# Patient Record
Sex: Male | Born: 1945 | Race: White | Hispanic: No | Marital: Married | Smoking: Former smoker
Health system: Southern US, Community
[De-identification: ages and names within clinical notes are randomized; demographics above are authoritative.]

## PROBLEM LIST (undated history)

## (undated) DIAGNOSIS — G4733 Obstructive sleep apnea (adult) (pediatric): Secondary | ICD-10-CM

## (undated) DIAGNOSIS — J45909 Unspecified asthma, uncomplicated: Secondary | ICD-10-CM

## (undated) DIAGNOSIS — F419 Anxiety disorder, unspecified: Secondary | ICD-10-CM

## (undated) DIAGNOSIS — K219 Gastro-esophageal reflux disease without esophagitis: Secondary | ICD-10-CM

## (undated) DIAGNOSIS — J449 Chronic obstructive pulmonary disease, unspecified: Secondary | ICD-10-CM

## (undated) DIAGNOSIS — J189 Pneumonia, unspecified organism: Secondary | ICD-10-CM

## (undated) DIAGNOSIS — I509 Heart failure, unspecified: Secondary | ICD-10-CM

## (undated) DIAGNOSIS — C801 Malignant (primary) neoplasm, unspecified: Secondary | ICD-10-CM

## (undated) DIAGNOSIS — G20 Parkinson's disease: Secondary | ICD-10-CM

## (undated) DIAGNOSIS — I10 Essential (primary) hypertension: Secondary | ICD-10-CM

## (undated) DIAGNOSIS — G20A1 Parkinson's disease without dyskinesia, without mention of fluctuations: Secondary | ICD-10-CM

## (undated) DIAGNOSIS — R0600 Dyspnea, unspecified: Secondary | ICD-10-CM

## (undated) HISTORY — PX: VEIN LIGATION AND STRIPPING: SHX2653

## (undated) HISTORY — DX: Heart failure, unspecified: I50.9

## (undated) HISTORY — DX: Anxiety disorder, unspecified: F41.9

## (undated) HISTORY — DX: Essential (primary) hypertension: I10

## (undated) HISTORY — DX: Gastro-esophageal reflux disease without esophagitis: K21.9

## (undated) HISTORY — PX: OTHER SURGICAL HISTORY: SHX169

---

## 2021-08-25 ENCOUNTER — Other Ambulatory Visit: Payer: Self-pay | Admitting: Family Medicine

## 2021-08-25 DIAGNOSIS — R1909 Other intra-abdominal and pelvic swelling, mass and lump: Secondary | ICD-10-CM

## 2021-09-02 ENCOUNTER — Other Ambulatory Visit: Payer: Self-pay

## 2021-09-02 ENCOUNTER — Ambulatory Visit
Admission: RE | Admit: 2021-09-02 | Discharge: 2021-09-02 | Disposition: A | Discharge status: Home / Self Care | Payer: Medicare Other | Source: Ambulatory Visit | Attending: Family Medicine | Admitting: Family Medicine

## 2021-09-02 DIAGNOSIS — R1909 Other intra-abdominal and pelvic swelling, mass and lump: Secondary | ICD-10-CM | POA: Diagnosis not present

## 2021-09-21 ENCOUNTER — Other Ambulatory Visit: Payer: Self-pay | Admitting: Infectious Diseases

## 2021-09-21 DIAGNOSIS — K219 Gastro-esophageal reflux disease without esophagitis: Secondary | ICD-10-CM

## 2021-09-21 DIAGNOSIS — R1031 Right lower quadrant pain: Secondary | ICD-10-CM

## 2021-09-21 DIAGNOSIS — R634 Abnormal weight loss: Secondary | ICD-10-CM

## 2021-09-21 DIAGNOSIS — G20 Parkinson's disease: Secondary | ICD-10-CM

## 2021-10-14 ENCOUNTER — Other Ambulatory Visit: Payer: Self-pay

## 2021-10-14 ENCOUNTER — Ambulatory Visit
Admission: RE | Admit: 2021-10-14 | Discharge: 2021-10-14 | Disposition: A | Discharge status: Home / Self Care | Payer: Medicare Other | Source: Ambulatory Visit | Attending: Infectious Diseases | Admitting: Infectious Diseases

## 2021-10-14 DIAGNOSIS — K219 Gastro-esophageal reflux disease without esophagitis: Secondary | ICD-10-CM

## 2021-10-14 DIAGNOSIS — G20 Parkinson's disease: Secondary | ICD-10-CM

## 2021-10-14 DIAGNOSIS — R634 Abnormal weight loss: Secondary | ICD-10-CM

## 2021-10-14 DIAGNOSIS — R1031 Right lower quadrant pain: Secondary | ICD-10-CM

## 2021-10-15 ENCOUNTER — Ambulatory Visit
Admission: RE | Admit: 2021-10-15 | Discharge: 2021-10-15 | Disposition: A | Discharge status: Home / Self Care | Payer: Medicare Other | Source: Ambulatory Visit | Attending: Infectious Diseases | Admitting: Infectious Diseases

## 2021-10-15 ENCOUNTER — Other Ambulatory Visit: Payer: Self-pay

## 2021-10-15 DIAGNOSIS — R634 Abnormal weight loss: Secondary | ICD-10-CM | POA: Insufficient documentation

## 2021-10-15 DIAGNOSIS — G20 Parkinson's disease: Secondary | ICD-10-CM | POA: Insufficient documentation

## 2021-10-15 DIAGNOSIS — K219 Gastro-esophageal reflux disease without esophagitis: Secondary | ICD-10-CM | POA: Insufficient documentation

## 2021-10-15 DIAGNOSIS — R1031 Right lower quadrant pain: Secondary | ICD-10-CM | POA: Insufficient documentation

## 2021-10-15 IMAGING — CT CT ABD-PELV W/ CM
2 of 5 series · 15 of 46 positions shown, 17 images · IV contrast (omnipaque)
Comparison: None.

CLINICAL DATA: Intermittently palpable right inguinal lumps

EXAM:
CT ABDOMEN AND PELVIS WITH CONTRAST
TECHNIQUE: Multidetector CT imaging of the abdomen and pelvis was performed
using the standard protocol following bolus administration of
intravenous contrast.
CONTRAST:  85mL OMNIPAQUE IOHEXOL 300 MG/ML SOLN, additional oral
enteric contrast

[Series 2: abd pelvis 5.00 · axial · 0.66mm/px · z∈[-1618,-1203]mm · 12 of 95 slices shown, 14 images]
[im 6/95  soft-tissue]
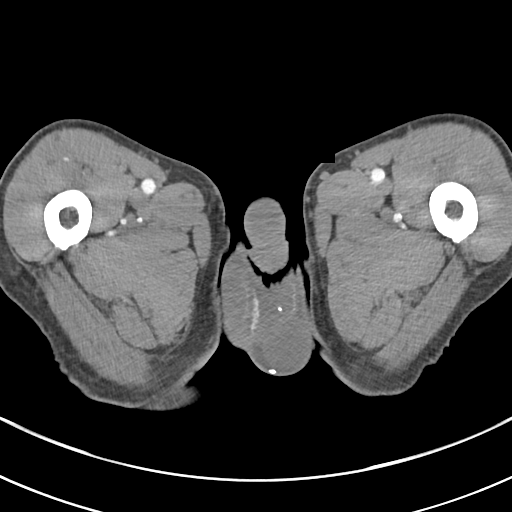
[im 6/95  bone]
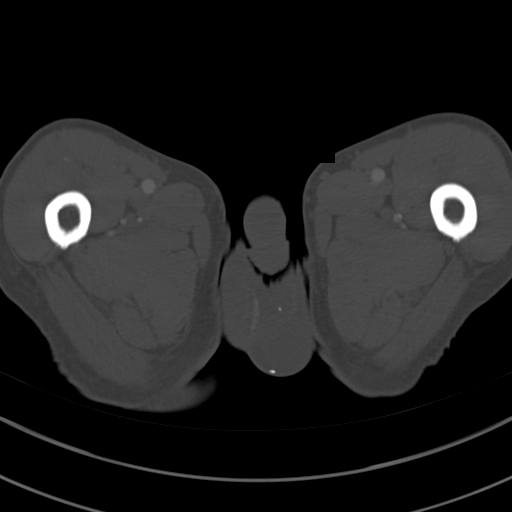
[im 17/95  soft-tissue]
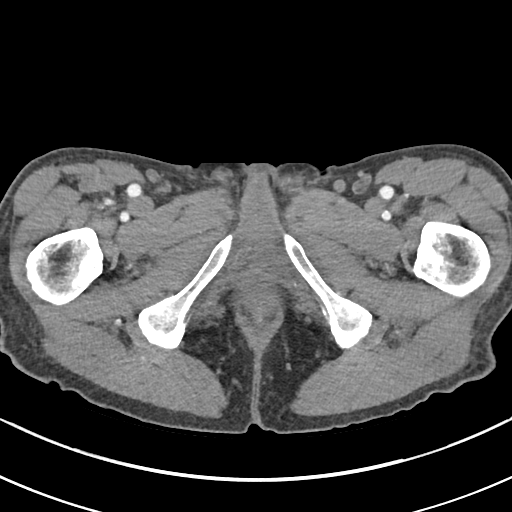
[im 23/95  soft-tissue]
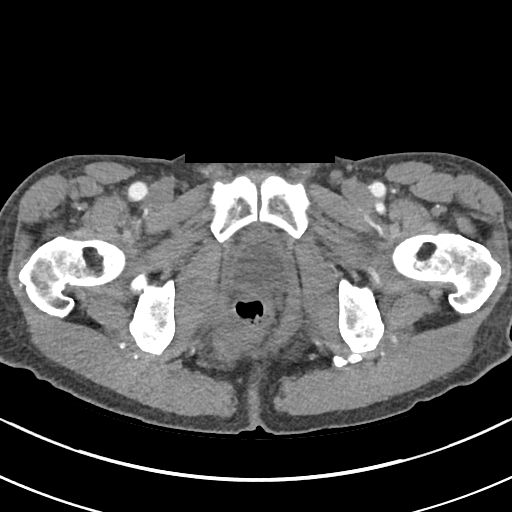
[im 28/95  soft-tissue]
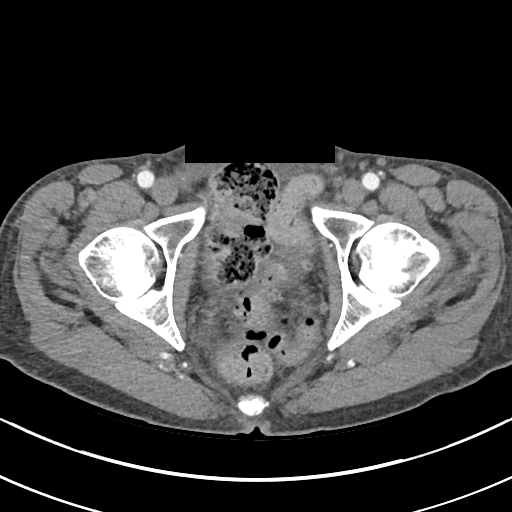
[im 39/95  soft-tissue]
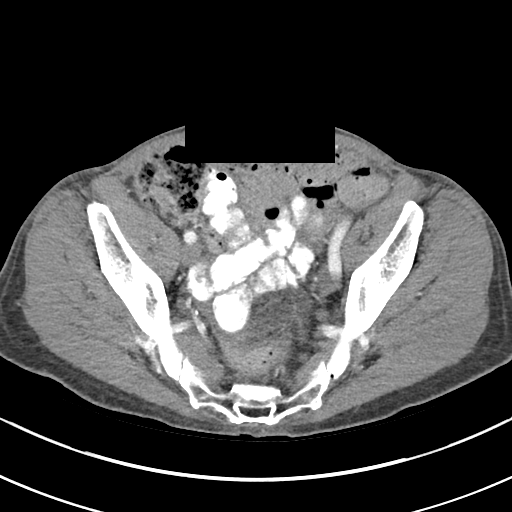
[im 45/95  soft-tissue]
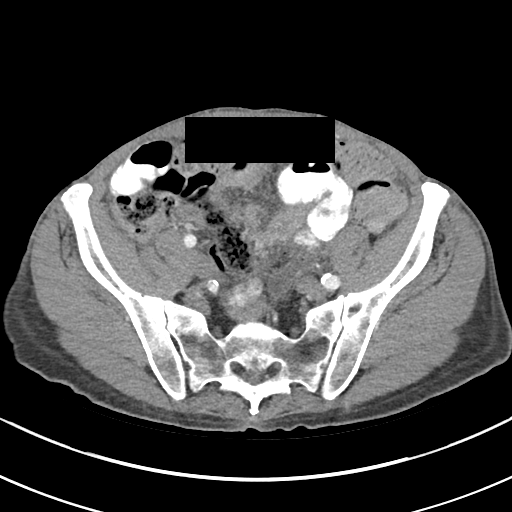
[im 50/95  soft-tissue]
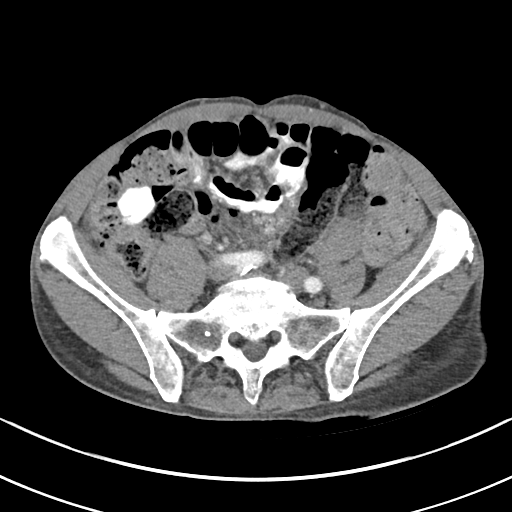
[im 61/95  soft-tissue]
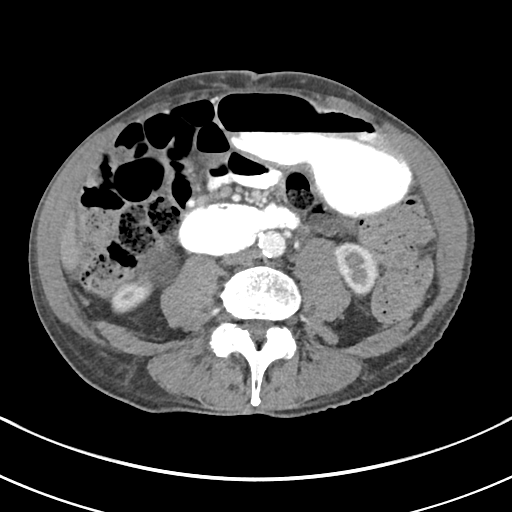
[im 67/95  soft-tissue]
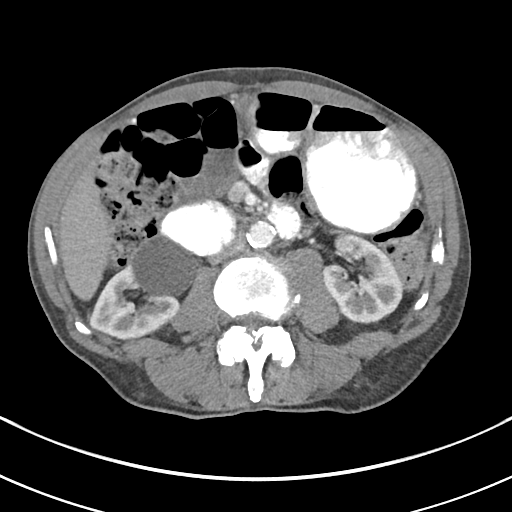
[im 67/95  bone]
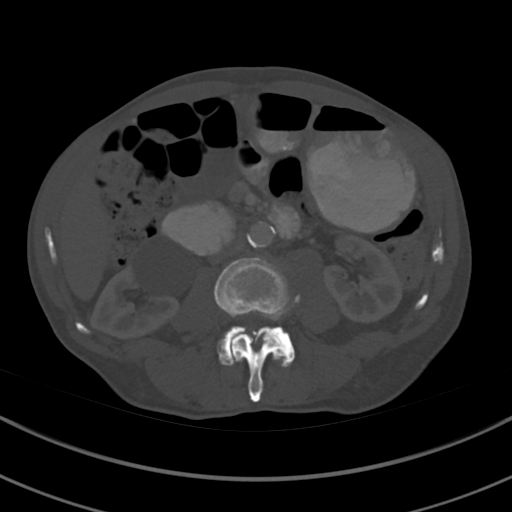
[im 72/95  soft-tissue]
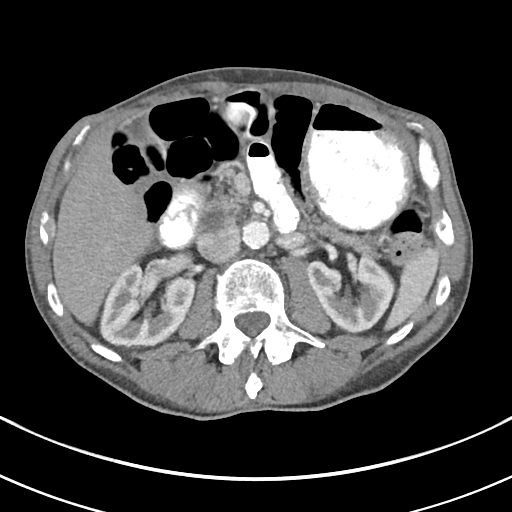
[im 83/95  soft-tissue]
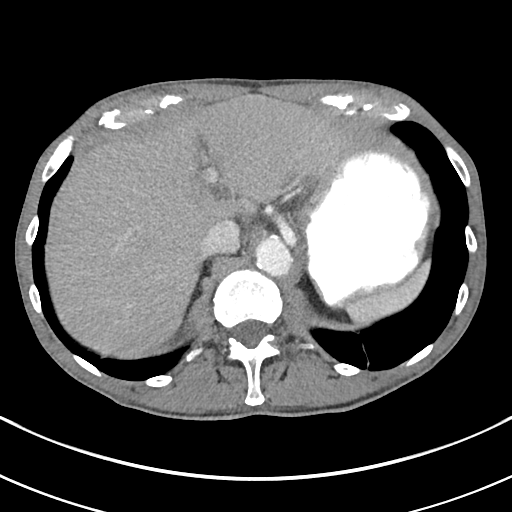
[im 89/95  soft-tissue]
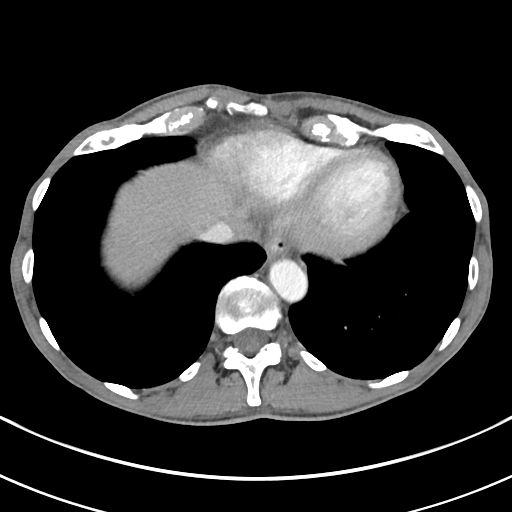

[Series 4: coronals abd pelvis 2.00 cor · coronal · 0.66mm/px · 3 of 125 slices shown]
[im 42/125  soft-tissue]
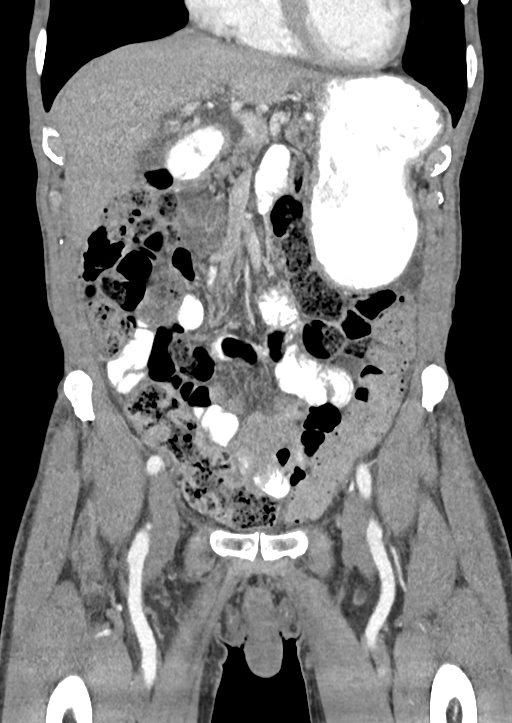
[im 56/125  soft-tissue]
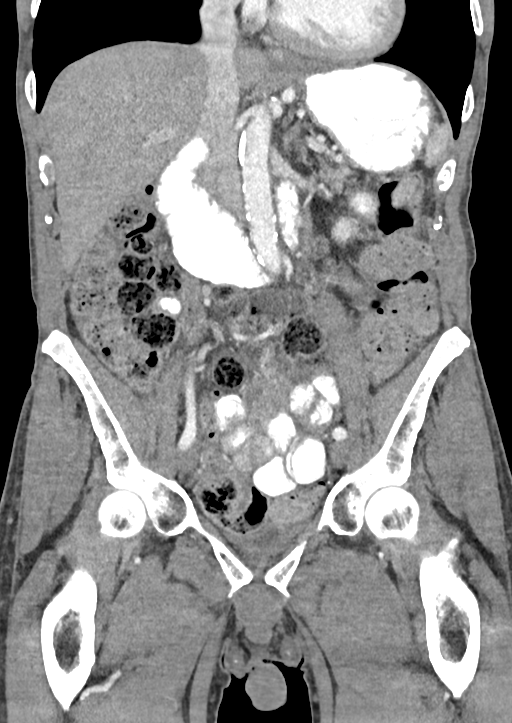
[im 69/125  soft-tissue]
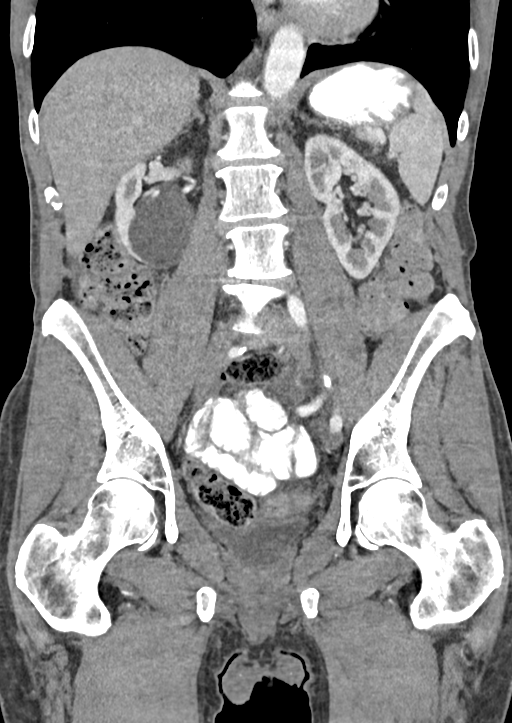

[15 of 46 positions shown; findings below may reference images not displayed]

FINDINGS: Lower chest: No acute abnormality.

Hepatobiliary: No solid liver abnormality is seen. No gallstones or
gallbladder wall thickening. Mild dilatation of the central common
bile duct up to 1.0 cm, without evidence of obstructing calculus or
other abnormality to the ampulla (series 4, image 47).

Pancreas: Unremarkable. No pancreatic ductal dilatation or
surrounding inflammatory changes.

Spleen: Normal in size without significant abnormality.

Adrenals/Urinary Tract: Adrenal glands are unremarkable. Benign
exophytic cyst of the inferior pole the right kidney. Kidneys are
otherwise normal, without renal calculi, solid lesion, or
hydronephrosis. Bladder is unremarkable.

Stomach/Bowel: Stomach is within normal limits. Appendix is not
clearly visualized. No evidence of bowel wall thickening,
distention, or inflammatory changes. Descending and sigmoid
diverticulosis. Large burden of stool throughout the colon and
rectum.

Vascular/Lymphatic: Aortic atherosclerosis. No enlarged abdominal or
pelvic lymph nodes.

Reproductive: Status post prostatectomy.

Other: No abdominal wall hernia or abnormality. No abdominopelvic
ascites.

Musculoskeletal: No acute or significant osseous findings.
IMPRESSION: 1. No mass, hernia, lymphadenopathy, or other abnormality of the
right inguinal region.
2. Status post prostatectomy. No evidence of mass or lymphadenopathy
in the abdomen or pelvis.
3. Mild dilatation of the central common bile duct up to 1.0 cm,
without evidence of obstructing calculus or other abnormality to the
ampulla. Consider further evaluation via MRCP if there is clinical
or biochemical evidence of cholestasis.
4. Descending and sigmoid diverticulosis without evidence of acute
diverticulitis. Large burden of stool throughout the colon and
rectum.

Aortic Atherosclerosis ([3N]-[3N]).

## 2021-10-15 MED ORDER — IOHEXOL 300 MG/ML  SOLN
85.0000 mL | Freq: Once | INTRAMUSCULAR | Status: AC | PRN
  Administered 2021-10-15: 85 mL via INTRAVENOUS

## 2021-10-22 ENCOUNTER — Ambulatory Visit: Admission: RE | Admit: 2021-10-22 | Payer: Medicare Other | Source: Ambulatory Visit

## 2021-11-08 ENCOUNTER — Ambulatory Visit: Payer: Self-pay | Admitting: Surgery

## 2021-11-08 NOTE — H&P (Signed)
Subjective:  CC: Right inguinal hernia [K40.90]  HPI:  Carl Mcclain is a 76 y.o. male who was referred by Renato Shin Fitzgeral* for evaluation of above. Symptoms were first noted 1 year ago. Three areas of enlarging areas. Feels occasional fluid in these "lumps", all localized to right groin, extending into scrotum occasionally. Pain is intermittent and discomfort, confined to the right groin, without radiation.  Associated with lump, exacerbated by exertion.  Lump is reducible.    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  Past Surgical History:  Past Surgical History: Procedure Laterality Date  PROSTATE SURGERY     Family History: family history includes Breast cancer in his mother; Cancer in his maternal grandmother; Heart disease in his father and paternal grandfather; Prostate cancer in his brother.  Social History:  reports that he quit smoking about 43 years ago. His smoking use included cigarettes. He has a 10.00 pack-year smoking history. He has never used smokeless tobacco. He reports that he does not currently use alcohol. He reports that he does not use drugs.  Current Medications: has a current medication list which includes the following prescription(s): budesonide, carbidopa-levodopa, carbidopa-levodopa, mirtazapine, tiotropium bromide, trazodone, ventolin hfa, budesonide-formoterol, trelegy ellipta, and gabapentin.  Allergies:  Allergies as of 11/08/2021  (No Known Allergies)   ROS:  A 15 point review of systems was performed and pertinent positives and negatives noted in HPI   Objective:    BP (!) 143/81    Pulse 95    Ht 182.9 cm (6')    Wt 67.1 kg (148 lb)    BMI 20.07 kg/m   Constitutional :  Alert, cooperative,  no distress Lymphatics/Throat:  Supple, no lymphadenopathy Respiratory:  clear to auscultation bilaterally Cardiovascular:  regular rate and rhythm Gastrointestinal: soft, non-tender; bowel sounds normal; no masses,  no organomegaly. Right groin area with area of weakness, no obvious hernia contents on exam today Musculoskeletal: Steady gait and movement Skin: Cool and moist, abdominal surgical scars  Psychiatric: Normal affect, non-agitated, not confused     LABS:  n/a   RADS: CLINICAL DATA:  Intermittently palpable right inguinal lumps   EXAM:  CT ABDOMEN AND PELVIS WITH CONTRAST   TECHNIQUE:  Multidetector CT imaging of the abdomen and pelvis was performed  using the standard protocol following bolus administration of  intravenous contrast.   CONTRAST:  76mL OMNIPAQUE IOHEXOL 300 MG/ML SOLN, additional oral  enteric contrast   COMPARISON:  None.   FINDINGS:  Lower chest: No acute abnormality.   Hepatobiliary: No solid liver abnormality is seen. No gallstones or  gallbladder wall thickening. Mild dilatation of the central common  bile duct up to 1.0 cm, without evidence of obstructing calculus or  other abnormality to the ampulla (series 4, image 47).   Pancreas: Unremarkable. No pancreatic ductal dilatation or  surrounding inflammatory changes.   Spleen: Normal in size without significant abnormality.   Adrenals/Urinary Tract: Adrenal glands are unremarkable. Benign  exophytic cyst of the inferior pole the right kidney. Kidneys are  otherwise normal, without renal calculi, solid lesion, or  hydronephrosis. Bladder is unremarkable.   Stomach/Bowel: Stomach is within normal limits. Appendix is not  clearly visualized. No evidence of bowel wall thickening,  distention, or inflammatory changes. Descending and sigmoid  diverticulosis. Large burden of stool throughout the colon and  rectum.   Vascular/Lymphatic: Aortic atherosclerosis. No enlarged abdominal or   pelvic lymph nodes.   Reproductive: Status post prostatectomy.   Other: No abdominal wall hernia or abnormality. No abdominopelvic  ascites.   Musculoskeletal: No acute or significant osseous findings.   IMPRESSION:  1. No mass, hernia, lymphadenopathy, or other abnormality of the  right inguinal region.  2. Status post prostatectomy. No evidence of mass or lymphadenopathy  in the abdomen or pelvis.  3. Mild dilatation of the central common bile duct up to 1.0 cm,  without evidence of obstructing calculus or other abnormality to the  ampulla. Consider further evaluation via MRCP if there is clinical  or biochemical evidence of cholestasis.  4. Descending and sigmoid diverticulosis without evidence of acute  diverticulitis. Large burden of stool throughout the colon and  rectum.   Aortic Atherosclerosis (ICD10-I70.0).    Electronically Signed    By: Delanna Ahmadi M.D.    On: 10/18/2021 09:59  Assessment:      Right inguinal hernia [K40.90]  CT not showing anything obvious but history and clinical exam concerning enough to recommend proceeding with repair  Plan:    1. Right inguinal hernia [K40.90]   Discussed the risk of surgery including recurrence, which can be up to 50% in the case of incisional or complex hernias, possible use of prosthetic materials (mesh) and the increased risk of mesh infxn if used, bleeding, chronic pain, post-op infxn, post-op SBO or ileus, and possible re-operation to address said risks. The risks of general anesthetic, if used, includes MI, CVA, sudden death or even reaction to anesthetic medications also discussed. Alternatives include continued observation.  Benefits include possible symptom relief, prevention of incarceration, strangulation, enlargement in size over time, and the risk of emergency surgery in the face of strangulation.   Typical post-op recovery time of 3-5 days with 2 weeks of activity restrictions were also discussed.  ED  return precautions given for sudden increase in pain, size of hernia with accompanying fever, nausea, and/or vomiting.  The patient verbalized understanding and all questions were answered to the patient's satisfaction.   2. Patient has elected to proceed with surgical treatment. Procedure will be scheduled. Open due to hx of prostate surgery.

## 2021-11-08 NOTE — H&P (View-Only) (Signed)
Subjective:  CC: Right inguinal hernia [K40.90]  HPI:  Marciano Mundt is a 76 y.o. male who was referred by Renato Shin Fitzgeral* for evaluation of above. Symptoms were first noted 1 year ago. Three areas of enlarging areas. Feels occasional fluid in these "lumps", all localized to right groin, extending into scrotum occasionally. Pain is intermittent and discomfort, confined to the right groin, without radiation.  Associated with lump, exacerbated by exertion.  Lump is reducible.    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  Past Surgical History:  Past Surgical History: Procedure Laterality Date  PROSTATE SURGERY     Family History: family history includes Breast cancer in his mother; Cancer in his maternal grandmother; Heart disease in his father and paternal grandfather; Prostate cancer in his brother.  Social History:  reports that he quit smoking about 43 years ago. His smoking use included cigarettes. He has a 10.00 pack-year smoking history. He has never used smokeless tobacco. He reports that he does not currently use alcohol. He reports that he does not use drugs.  Current Medications: has a current medication list which includes the following prescription(s): budesonide, carbidopa-levodopa, carbidopa-levodopa, mirtazapine, tiotropium bromide, trazodone, ventolin hfa, budesonide-formoterol, trelegy ellipta, and gabapentin.  Allergies:  Allergies as of 11/08/2021  (No Known Allergies)   ROS:  A 15 point review of systems was performed and pertinent positives and negatives noted in HPI   Objective:    BP (!) 143/81    Pulse 95    Ht 182.9 cm (6')    Wt 67.1 kg (148 lb)    BMI 20.07 kg/m   Constitutional :  Alert, cooperative,  no distress Lymphatics/Throat:  Supple, no lymphadenopathy Respiratory:  clear to auscultation bilaterally Cardiovascular:  regular rate and rhythm Gastrointestinal: soft, non-tender; bowel sounds normal; no masses,  no organomegaly. Right groin area with area of weakness, no obvious hernia contents on exam today Musculoskeletal: Steady gait and movement Skin: Cool and moist, abdominal surgical scars  Psychiatric: Normal affect, non-agitated, not confused     LABS:  n/a   RADS: CLINICAL DATA:  Intermittently palpable right inguinal lumps   EXAM:  CT ABDOMEN AND PELVIS WITH CONTRAST   TECHNIQUE:  Multidetector CT imaging of the abdomen and pelvis was performed  using the standard protocol following bolus administration of  intravenous contrast.   CONTRAST:  12mL OMNIPAQUE IOHEXOL 300 MG/ML SOLN, additional oral  enteric contrast   COMPARISON:  None.   FINDINGS:  Lower chest: No acute abnormality.   Hepatobiliary: No solid liver abnormality is seen. No gallstones or  gallbladder wall thickening. Mild dilatation of the central common  bile duct up to 1.0 cm, without evidence of obstructing calculus or  other abnormality to the ampulla (series 4, image 47).   Pancreas: Unremarkable. No pancreatic ductal dilatation or  surrounding inflammatory changes.   Spleen: Normal in size without significant abnormality.   Adrenals/Urinary Tract: Adrenal glands are unremarkable. Benign  exophytic cyst of the inferior pole the right kidney. Kidneys are  otherwise normal, without renal calculi, solid lesion, or  hydronephrosis. Bladder is unremarkable.   Stomach/Bowel: Stomach is within normal limits. Appendix is not  clearly visualized. No evidence of bowel wall thickening,  distention, or inflammatory changes. Descending and sigmoid  diverticulosis. Large burden of stool throughout the colon and  rectum.   Vascular/Lymphatic: Aortic atherosclerosis. No enlarged abdominal or   pelvic lymph nodes.   Reproductive: Status post prostatectomy.   Other: No abdominal wall hernia or abnormality. No abdominopelvic  ascites.   Musculoskeletal: No acute or significant osseous findings.   IMPRESSION:  1. No mass, hernia, lymphadenopathy, or other abnormality of the  right inguinal region.  2. Status post prostatectomy. No evidence of mass or lymphadenopathy  in the abdomen or pelvis.  3. Mild dilatation of the central common bile duct up to 1.0 cm,  without evidence of obstructing calculus or other abnormality to the  ampulla. Consider further evaluation via MRCP if there is clinical  or biochemical evidence of cholestasis.  4. Descending and sigmoid diverticulosis without evidence of acute  diverticulitis. Large burden of stool throughout the colon and  rectum.   Aortic Atherosclerosis (ICD10-I70.0).    Electronically Signed    By: Delanna Ahmadi M.D.    On: 10/18/2021 09:59  Assessment:      Right inguinal hernia [K40.90]  CT not showing anything obvious but history and clinical exam concerning enough to recommend proceeding with repair  Plan:    1. Right inguinal hernia [K40.90]   Discussed the risk of surgery including recurrence, which can be up to 50% in the case of incisional or complex hernias, possible use of prosthetic materials (mesh) and the increased risk of mesh infxn if used, bleeding, chronic pain, post-op infxn, post-op SBO or ileus, and possible re-operation to address said risks. The risks of general anesthetic, if used, includes MI, CVA, sudden death or even reaction to anesthetic medications also discussed. Alternatives include continued observation.  Benefits include possible symptom relief, prevention of incarceration, strangulation, enlargement in size over time, and the risk of emergency surgery in the face of strangulation.   Typical post-op recovery time of 3-5 days with 2 weeks of activity restrictions were also discussed.  ED  return precautions given for sudden increase in pain, size of hernia with accompanying fever, nausea, and/or vomiting.  The patient verbalized understanding and all questions were answered to the patient's satisfaction.   2. Patient has elected to proceed with surgical treatment. Procedure will be scheduled. Open due to hx of prostate surgery.

## 2021-11-11 ENCOUNTER — Other Ambulatory Visit
Admission: RE | Admit: 2021-11-11 | Discharge: 2021-11-11 | Disposition: A | Discharge status: Home / Self Care | Payer: Medicare Other | Source: Ambulatory Visit | Attending: Surgery | Admitting: Surgery

## 2021-11-11 ENCOUNTER — Other Ambulatory Visit: Payer: Self-pay

## 2021-11-11 DIAGNOSIS — Z01818 Encounter for other preprocedural examination: Secondary | ICD-10-CM

## 2021-11-11 DIAGNOSIS — Z01812 Encounter for preprocedural laboratory examination: Secondary | ICD-10-CM

## 2021-11-11 HISTORY — DX: Chronic obstructive pulmonary disease, unspecified: J44.9

## 2021-11-11 HISTORY — DX: Pneumonia, unspecified organism: J18.9

## 2021-11-11 HISTORY — DX: Unspecified asthma, uncomplicated: J45.909

## 2021-11-11 HISTORY — DX: Dyspnea, unspecified: R06.00

## 2021-11-11 HISTORY — DX: Parkinson's disease without dyskinesia, without mention of fluctuations: G20.A1

## 2021-11-11 HISTORY — DX: Malignant (primary) neoplasm, unspecified: C80.1

## 2021-11-11 HISTORY — DX: Parkinson's disease: G20

## 2021-11-11 NOTE — Patient Instructions (Addendum)
Your procedure is scheduled on: 11/18/21 - Thursday Report to the Registration Desk on the 1st floor of the [facility]. To find out your arrival time, please call [phone] between 1PM - 3PM on: 11/17/21 - Wednesday   REMEMBER: Instructions that are not followed completely may result in serious medical risk, up to and including death; or upon the discretion of your surgeon and anesthesiologist your surgery may need to be rescheduled.  Do not eat food after midnight the night before surgery.  No gum chewing, lozengers or hard candies.  You may however, drink CLEAR liquids up to 2 hours before you are scheduled to arrive for your surgery. Do not drink anything within 2 hours of your scheduled arrival time.  Clear liquids include: - water  - apple juice without pulp - gatorade (not RED, PURPLE, OR BLUE) - black coffee or tea (Do NOT add milk or creamers to the coffee or tea) Do NOT drink anything that is not on this list.   TAKE THESE MEDICATIONS THE MORNING OF SURGERY WITH A SIP OF WATER:  - Tiotropium Bromide Monohydrate (SPIRIVA RESPIMAT) 2.5 MCG/ACT AERS - Carbidopa-Levodopa ER (SINEMET CR) 25-100 MG tablet controlled release  Use  albuterol (VENTOLIN HFA) 108 (90 Base) MCG/ACT inhaler on the day of surgery and bring to the hospital.  One week prior to surgery: Stop Anti-inflammatories (NSAIDS) such as Advil, Aleve, Ibuprofen, Motrin, Naproxen, Naprosyn and Aspirin based products such as Excedrin, Goodys Powder, BC Powder.  Stop ANY OVER THE COUNTER supplements until after surgery.  You may however, continue to take Tylenol if needed for pain up until the day of surgery.  No Alcohol for 24 hours before or after surgery.  No Smoking including e-cigarettes for 24 hours prior to surgery.  No chewable tobacco products for at least 6 hours prior to surgery.  No nicotine patches on the day of surgery.  Do not use any "recreational" drugs for at least a week prior to your  surgery.  Please be advised that the combination of cocaine and anesthesia may have negative outcomes, up to and including death. If you test positive for cocaine, your surgery will be cancelled.  On the morning of surgery brush your teeth with toothpaste and water, you may rinse your mouth with mouthwash if you wish. Do not swallow any toothpaste or mouthwash.  Use CHG Soap or wipes as directed on instruction sheet.  Do not wear jewelry, make-up, hairpins, clips or nail polish.  Do not wear lotions, powders, or perfumes.   Do not shave body from the neck down 48 hours prior to surgery just in case you cut yourself which could leave a site for infection.  Also, freshly shaved skin may become irritated if using the CHG soap.  Contact lenses, hearing aids and dentures may not be worn into surgery.  Do not bring valuables to the hospital. [facility] is not responsible for any missing/lost belongings or valuables.   Notify your doctor if there is any change in your medical condition (cold, fever, infection).  Wear comfortable clothing (specific to your surgery type) to the hospital.  After surgery, you can help prevent lung complications by doing breathing exercises.  Take deep breaths and cough every 1-2 hours. Your doctor may order a device called an Incentive Spirometer to help you take deep breaths. When coughing or sneezing, hold a pillow firmly against your incision with both hands. This is called splinting. Doing this helps protect your incision. It also decreases  belly discomfort.  If you are being admitted to the hospital overnight, leave your suitcase in the car. After surgery it may be brought to your room.  If you are being discharged the day of surgery, you will not be allowed to drive home. You will need a responsible adult (18 years or older) to drive you home and stay with you that night.   If you are taking public transportation, you will need to have a responsible  adult (18 years or older) with you. Please confirm with your physician that it is acceptable to use public transportation.   Please call the [facility] at [phone] if you have any questions about these instructions.  Surgery Visitation Policy:  Patients undergoing a surgery or procedure may have one family member or support person with them as long as that person is not COVID-19 positive or experiencing its symptoms.  That person may remain in the waiting area during the procedure and may rotate out with other people.  Inpatient Visitation:    Visiting hours are 7 a.m. to 8 p.m. Up to two visitors ages 16+ are allowed at one time in a patient room. The visitors may rotate out with other people during the day. Visitors must check out when they leave, or other visitors will not be allowed. One designated support person may remain overnight. The visitor must pass COVID-19 screenings, use hand sanitizer when entering and exiting the patients room and wear a mask at all times, including in the patients room. Patients must also wear a mask when staff or their visitor are in the room. Masking is required regardless of vaccination status.

## 2021-11-12 ENCOUNTER — Other Ambulatory Visit
Admission: RE | Admit: 2021-11-12 | Discharge: 2021-11-12 | Disposition: A | Discharge status: Home / Self Care | Payer: Medicare Other | Source: Ambulatory Visit | Attending: Surgery | Admitting: Surgery

## 2021-11-12 DIAGNOSIS — Z01818 Encounter for other preprocedural examination: Secondary | ICD-10-CM | POA: Insufficient documentation

## 2021-11-12 DIAGNOSIS — Z01810 Encounter for preprocedural cardiovascular examination: Secondary | ICD-10-CM

## 2021-11-12 LAB — CBC
HCT: 39.9 % (ref 39.0–52.0)
Hemoglobin: 13.5 g/dL (ref 13.0–17.0)
MCH: 31.8 pg (ref 26.0–34.0)
MCHC: 33.8 g/dL (ref 30.0–36.0)
MCV: 93.9 fL (ref 80.0–100.0)
Platelets: 185 10*3/uL (ref 150–400)
RBC: 4.25 MIL/uL (ref 4.22–5.81)
RDW: 13.2 % (ref 11.5–15.5)
WBC: 4.7 10*3/uL (ref 4.0–10.5)
nRBC: 0 % (ref 0.0–0.2)

## 2021-11-12 LAB — BASIC METABOLIC PANEL
Anion gap: 8 (ref 5–15)
BUN: 19 mg/dL (ref 8–23)
CO2: 28 mmol/L (ref 22–32)
Calcium: 8.9 mg/dL (ref 8.9–10.3)
Chloride: 97 mmol/L — ABNORMAL LOW (ref 98–111)
Creatinine, Ser: 1.05 mg/dL (ref 0.61–1.24)
GFR, Estimated: >60 mL/min (ref >60)
Glucose, Bld: 104 mg/dL — ABNORMAL HIGH (ref 70–99)
Potassium: 3.9 mmol/L (ref 3.5–5.1)
Sodium: 133 mmol/L — ABNORMAL LOW (ref 135–145)

## 2021-11-12 NOTE — Pre-Procedure Instructions (Signed)
Faxed request for cardiac clearance per anesthesia to Dr Brandy Hale office. Their office will have to set up clearance due to pt not having cardiologist. Faxed this over with ekg with fax confirmation confirmed

## 2021-11-15 NOTE — Pre-Procedure Instructions (Signed)
Called over to Dr Ines Bloomer office to make sure they received the fax for cardiac clearance that I faxed over late Friday evening. Spoke with Charlena Cross and she confirmed with nurse that they did receive cardiac clearance request

## 2021-11-17 MED ORDER — FAMOTIDINE 20 MG PO TABS: 20.0000 mg | ORAL_TABLET | Freq: Once | ORAL | Status: AC

## 2021-11-17 MED ORDER — CHLORHEXIDINE GLUCONATE 0.12 % MT SOLN: 15.0000 mL | Freq: Once | OROMUCOSAL | Status: AC

## 2021-11-17 MED ORDER — CELECOXIB 200 MG PO CAPS: 200.0000 mg | ORAL_CAPSULE | ORAL | Status: AC

## 2021-11-17 MED ORDER — CEFAZOLIN SODIUM-DEXTROSE 2-4 GM/100ML-% IV SOLN
2.0000 g | INTRAVENOUS | Status: AC
  Administered 2021-11-18: 2 g via INTRAVENOUS

## 2021-11-17 MED ORDER — GABAPENTIN 300 MG PO CAPS: 300.0000 mg | ORAL_CAPSULE | ORAL | Status: AC

## 2021-11-17 MED ORDER — ACETAMINOPHEN 500 MG PO TABS: 1000.0000 mg | ORAL_TABLET | ORAL | Status: AC

## 2021-11-17 MED ORDER — LACTATED RINGERS IV SOLN: INTRAVENOUS | Status: DC

## 2021-11-17 MED ORDER — CHLORHEXIDINE GLUCONATE CLOTH 2 % EX PADS: 6.0000 | MEDICATED_PAD | Freq: Once | CUTANEOUS | Status: DC

## 2021-11-17 MED ORDER — ORAL CARE MOUTH RINSE: 15.0000 mL | Freq: Once | OROMUCOSAL | Status: AC

## 2021-11-18 ENCOUNTER — Other Ambulatory Visit: Payer: Self-pay

## 2021-11-18 ENCOUNTER — Encounter: Payer: Self-pay | Admitting: Surgery

## 2021-11-18 ENCOUNTER — Encounter
Admission: RE | Disposition: A | Discharge status: Home / Self Care | Payer: Self-pay | Source: Home / Self Care | Attending: Surgery

## 2021-11-18 ENCOUNTER — Ambulatory Visit
Admission: RE | Admit: 2021-11-18 | Discharge: 2021-11-18 | Disposition: A | Discharge status: Home / Self Care | Payer: Medicare Other | Attending: Surgery | Admitting: Surgery

## 2021-11-18 ENCOUNTER — Ambulatory Visit: Payer: Medicare Other | Admitting: Anesthesiology

## 2021-11-18 DIAGNOSIS — Z79899 Other long term (current) drug therapy: Secondary | ICD-10-CM | POA: Insufficient documentation

## 2021-11-18 DIAGNOSIS — Z87891 Personal history of nicotine dependence: Secondary | ICD-10-CM | POA: Insufficient documentation

## 2021-11-18 DIAGNOSIS — K4090 Unilateral inguinal hernia, without obstruction or gangrene, not specified as recurrent: Secondary | ICD-10-CM | POA: Insufficient documentation

## 2021-11-18 DIAGNOSIS — J449 Chronic obstructive pulmonary disease, unspecified: Secondary | ICD-10-CM | POA: Insufficient documentation

## 2021-11-18 HISTORY — PX: INSERTION OF MESH: SHX5868

## 2021-11-18 HISTORY — PX: INGUINAL HERNIA REPAIR: SHX194

## 2021-11-18 SURGERY — REPAIR, HERNIA, INGUINAL, ADULT
Anesthesia: General | Site: Groin | Laterality: Right

## 2021-11-18 MED ORDER — CEFAZOLIN SODIUM-DEXTROSE 2-4 GM/100ML-% IV SOLN
INTRAVENOUS | Status: AC
  Filled 2021-11-18: qty 100

## 2021-11-18 MED ORDER — BUPIVACAINE-EPINEPHRINE (PF) 0.5% -1:200000 IJ SOLN
INTRAMUSCULAR | Status: AC
  Filled 2021-11-18: qty 30

## 2021-11-18 MED ORDER — FENTANYL CITRATE (PF) 100 MCG/2ML IJ SOLN
INTRAMUSCULAR | Status: AC
  Administered 2021-11-18: 25 ug via INTRAVENOUS
  Filled 2021-11-18: qty 2

## 2021-11-18 MED ORDER — 0.9 % SODIUM CHLORIDE (POUR BTL) OPTIME
TOPICAL | Status: DC | PRN
  Administered 2021-11-18: 100 mL

## 2021-11-18 MED ORDER — ONDANSETRON HCL 4 MG/2ML IJ SOLN
INTRAMUSCULAR | Status: AC
  Filled 2021-11-18: qty 2

## 2021-11-18 MED ORDER — LIDOCAINE HCL (PF) 2 % IJ SOLN
INTRAMUSCULAR | Status: AC
  Filled 2021-11-18: qty 5

## 2021-11-18 MED ORDER — DEXAMETHASONE SODIUM PHOSPHATE 10 MG/ML IJ SOLN
INTRAMUSCULAR | Status: AC
  Filled 2021-11-18: qty 1

## 2021-11-18 MED ORDER — DEXAMETHASONE SODIUM PHOSPHATE 10 MG/ML IJ SOLN
INTRAMUSCULAR | Status: DC | PRN
  Administered 2021-11-18: 10 mg via INTRAVENOUS

## 2021-11-18 MED ORDER — ALBUTEROL SULFATE HFA 108 (90 BASE) MCG/ACT IN AERS
INHALATION_SPRAY | RESPIRATORY_TRACT | Status: AC
  Filled 2021-11-18: qty 6.7

## 2021-11-18 MED ORDER — GABAPENTIN 300 MG PO CAPS
ORAL_CAPSULE | ORAL | Status: AC
  Administered 2021-11-18: 300 mg via ORAL
  Filled 2021-11-18: qty 1

## 2021-11-18 MED ORDER — BUPIVACAINE-EPINEPHRINE (PF) 0.5% -1:200000 IJ SOLN
INTRAMUSCULAR | Status: DC | PRN
  Administered 2021-11-18: 10 mL

## 2021-11-18 MED ORDER — FENTANYL CITRATE (PF) 100 MCG/2ML IJ SOLN
INTRAMUSCULAR | Status: DC | PRN
  Administered 2021-11-18: 50 ug via INTRAVENOUS
  Administered 2021-11-18: 25 ug via INTRAVENOUS

## 2021-11-18 MED ORDER — CELECOXIB 200 MG PO CAPS
ORAL_CAPSULE | ORAL | Status: AC
  Administered 2021-11-18: 200 mg via ORAL
  Filled 2021-11-18: qty 1

## 2021-11-18 MED ORDER — BUPIVACAINE-MELOXICAM ER 200-6 MG/7ML IJ SOLN
INTRAMUSCULAR | Status: DC | PRN
  Administered 2021-11-18: 200 mg

## 2021-11-18 MED ORDER — PROPOFOL 10 MG/ML IV BOLUS
INTRAVENOUS | Status: AC
  Filled 2021-11-18: qty 20

## 2021-11-18 MED ORDER — ONDANSETRON HCL 4 MG/2ML IJ SOLN
INTRAMUSCULAR | Status: DC | PRN
  Administered 2021-11-18: 4 mg via INTRAVENOUS

## 2021-11-18 MED ORDER — FENTANYL CITRATE (PF) 100 MCG/2ML IJ SOLN
25.0000 ug | INTRAMUSCULAR | Status: DC | PRN
  Administered 2021-11-18: 25 ug via INTRAVENOUS

## 2021-11-18 MED ORDER — ALBUTEROL SULFATE HFA 108 (90 BASE) MCG/ACT IN AERS
INHALATION_SPRAY | RESPIRATORY_TRACT | Status: DC | PRN
Start: 2021-11-18 — End: 2021-11-18
  Administered 2021-11-18: 4 via RESPIRATORY_TRACT

## 2021-11-18 MED ORDER — LIDOCAINE HCL (CARDIAC) PF 100 MG/5ML IV SOSY
PREFILLED_SYRINGE | INTRAVENOUS | Status: DC | PRN
  Administered 2021-11-18: 40 mg via INTRAVENOUS
  Administered 2021-11-18: 60 mg via INTRAVENOUS

## 2021-11-18 MED ORDER — ACETAMINOPHEN 500 MG PO TABS
ORAL_TABLET | ORAL | Status: AC
  Administered 2021-11-18: 1000 mg via ORAL
  Filled 2021-11-18: qty 2

## 2021-11-18 MED ORDER — BUPIVACAINE-MELOXICAM ER 200-6 MG/7ML IJ SOLN
INTRAMUSCULAR | Status: AC
  Filled 2021-11-18: qty 1

## 2021-11-18 MED ORDER — OXYCODONE HCL 5 MG/5ML PO SOLN: 5.0000 mg | Freq: Once | ORAL | Status: DC | PRN

## 2021-11-18 MED ORDER — ROCURONIUM BROMIDE 100 MG/10ML IV SOLN
INTRAVENOUS | Status: DC | PRN
  Administered 2021-11-18: 50 mg via INTRAVENOUS

## 2021-11-18 MED ORDER — CHLORHEXIDINE GLUCONATE 0.12 % MT SOLN
OROMUCOSAL | Status: AC
  Administered 2021-11-18: 15 mL via OROMUCOSAL
  Filled 2021-11-18: qty 15

## 2021-11-18 MED ORDER — HYDROCODONE-ACETAMINOPHEN 5-325 MG PO TABS: 1.0000 | ORAL_TABLET | Freq: Four times a day (QID) | ORAL | 0 refills | Status: DC | PRN

## 2021-11-18 MED ORDER — ACETAMINOPHEN 325 MG PO TABS: 650.0000 mg | ORAL_TABLET | Freq: Three times a day (TID) | ORAL | 0 refills | Status: AC | PRN

## 2021-11-18 MED ORDER — DOCUSATE SODIUM 100 MG PO CAPS: 100.0000 mg | ORAL_CAPSULE | Freq: Two times a day (BID) | ORAL | 0 refills | Status: AC | PRN

## 2021-11-18 MED ORDER — PHENYLEPHRINE 40 MCG/ML (10ML) SYRINGE FOR IV PUSH (FOR BLOOD PRESSURE SUPPORT)
PREFILLED_SYRINGE | INTRAVENOUS | Status: DC | PRN
  Administered 2021-11-18 (×2): 160 ug via INTRAVENOUS

## 2021-11-18 MED ORDER — SUGAMMADEX SODIUM 200 MG/2ML IV SOLN
INTRAVENOUS | Status: DC | PRN
  Administered 2021-11-18 (×2): 100 mg via INTRAVENOUS

## 2021-11-18 MED ORDER — GLYCOPYRROLATE 0.2 MG/ML IJ SOLN
INTRAMUSCULAR | Status: AC
  Filled 2021-11-18: qty 1

## 2021-11-18 MED ORDER — FAMOTIDINE 20 MG PO TABS
ORAL_TABLET | ORAL | Status: AC
  Administered 2021-11-18: 20 mg via ORAL
  Filled 2021-11-18: qty 1

## 2021-11-18 MED ORDER — EPHEDRINE SULFATE (PRESSORS) 50 MG/ML IJ SOLN
INTRAMUSCULAR | Status: DC | PRN
  Administered 2021-11-18: 10 mg via INTRAVENOUS

## 2021-11-18 MED ORDER — OXYCODONE HCL 5 MG PO TABS: 5.0000 mg | ORAL_TABLET | Freq: Once | ORAL | Status: DC | PRN

## 2021-11-18 MED ORDER — PROPOFOL 10 MG/ML IV BOLUS
INTRAVENOUS | Status: DC | PRN
  Administered 2021-11-18: 130 mg via INTRAVENOUS

## 2021-11-18 MED ORDER — PHENYLEPHRINE HCL (PRESSORS) 10 MG/ML IV SOLN: INTRAVENOUS | Status: DC | PRN

## 2021-11-18 MED ORDER — FENTANYL CITRATE (PF) 100 MCG/2ML IJ SOLN
INTRAMUSCULAR | Status: AC
  Filled 2021-11-18: qty 2

## 2021-11-18 MED ORDER — PHENYLEPHRINE HCL-NACL 20-0.9 MG/250ML-% IV SOLN
INTRAVENOUS | Status: DC | PRN
Start: 2021-11-18 — End: 2021-11-18
  Administered 2021-11-18: 35 ug/min via INTRAVENOUS

## 2021-11-18 MED ORDER — ROCURONIUM BROMIDE 10 MG/ML (PF) SYRINGE
PREFILLED_SYRINGE | INTRAVENOUS | Status: AC
  Filled 2021-11-18: qty 10

## 2021-11-18 MED ORDER — GLYCOPYRROLATE 0.2 MG/ML IJ SOLN
INTRAMUSCULAR | Status: DC | PRN
  Administered 2021-11-18: .2 mg via INTRAVENOUS

## 2021-11-18 SURGICAL SUPPLY — 44 items
ADH SKN CLS APL DERMABOND .7 (GAUZE/BANDAGES/DRESSINGS) ×2
APL PRP STRL LF DISP 70% ISPRP (MISCELLANEOUS) ×2
BLADE CLIPPER SURG (BLADE) ×3 IMPLANT
BLADE SURG 15 STRL LF DISP TIS (BLADE) ×2 IMPLANT
BLADE SURG 15 STRL SS (BLADE) ×3
CHLORAPREP W/TINT 26 (MISCELLANEOUS) ×3 IMPLANT
DERMABOND ADVANCED (GAUZE/BANDAGES/DRESSINGS) ×1
DERMABOND ADVANCED .7 DNX12 (GAUZE/BANDAGES/DRESSINGS) ×2 IMPLANT
DRAIN PENROSE 12X.25 LTX STRL (MISCELLANEOUS) ×3 IMPLANT
DRAPE LAPAROTOMY 100X77 ABD (DRAPES) ×3 IMPLANT
ELECT REM PT RETURN 9FT ADLT (ELECTROSURGICAL) ×3
ELECTRODE REM PT RTRN 9FT ADLT (ELECTROSURGICAL) ×2 IMPLANT
GAUZE 4X4 16PLY ~~LOC~~+RFID DBL (SPONGE) ×3 IMPLANT
GLOVE SURG SYN 6.5 ES PF (GLOVE) ×12 IMPLANT
GLOVE SURG SYN 6.5 PF PI (GLOVE) ×4 IMPLANT
GLOVE SURG UNDER POLY LF SZ7 (GLOVE) ×6 IMPLANT
GOWN STRL REUS W/ TWL LRG LVL3 (GOWN DISPOSABLE) ×4 IMPLANT
GOWN STRL REUS W/TWL LRG LVL3 (GOWN DISPOSABLE) ×12
LABEL OR SOLS (LABEL) ×3 IMPLANT
MANIFOLD NEPTUNE II (INSTRUMENTS) ×3 IMPLANT
MESH HERNIA 6X13 (Mesh General) IMPLANT
MESH HERNIA ULTRAPRO MED (Mesh General) IMPLANT
MESH MARLEX PLUG MEDIUM (Mesh General) IMPLANT
MESH PARIETEX PROGRIP LEFT (Mesh General) IMPLANT
MESH PARIETEX PROGRIP RIGHT (Mesh General) ×1 IMPLANT
MESH SYNTHETIC KEYHOLE S (Mesh General) IMPLANT
NEEDLE HYPO 22GX1.5 SAFETY (NEEDLE) ×6 IMPLANT
NS IRRIG 500ML POUR BTL (IV SOLUTION) ×3 IMPLANT
PACK BASIN MINOR ARMC (MISCELLANEOUS) ×3 IMPLANT
SUT ETHIBOND NAB MO 7 #0 18IN (SUTURE) ×3 IMPLANT
SUT MNCRL 4-0 (SUTURE) ×3
SUT MNCRL 4-0 27XMFL (SUTURE) ×2
SUT SILK 3 0 12 30 (SUTURE) IMPLANT
SUT SILK 3 0 SH 30 (SUTURE) IMPLANT
SUT VIC AB 2-0 CT2 27 (SUTURE) ×3 IMPLANT
SUT VIC AB 3-0 SH 27 (SUTURE) ×3
SUT VIC AB 3-0 SH 27X BRD (SUTURE) ×2 IMPLANT
SUTURE MNCRL 4-0 27XMF (SUTURE) ×2 IMPLANT
SYR 10ML LL (SYRINGE) ×3 IMPLANT
SYR 20ML LL LF (SYRINGE) ×3 IMPLANT
SYR BULB IRRIG 60ML STRL (SYRINGE) ×3 IMPLANT
TOWEL OR 17X26 4PK STRL BLUE (TOWEL DISPOSABLE) ×2 IMPLANT
WATER STERILE IRR 1000ML POUR (IV SOLUTION) ×2 IMPLANT
WATER STERILE IRR 500ML POUR (IV SOLUTION) ×2 IMPLANT

## 2021-11-18 NOTE — Anesthesia Procedure Notes (Signed)
Procedure Name: Intubation Date/Time: 11/18/2021 8:50 AM Performed by: Loletha Grayer, CRNA Pre-anesthesia Checklist: Patient identified, Patient being monitored, Timeout performed, Emergency Drugs available and Suction available Patient Re-evaluated:Patient Re-evaluated prior to induction Oxygen Delivery Method: Circle system utilized Preoxygenation: Pre-oxygenation with 100% oxygen Induction Type: IV induction Ventilation: Mask ventilation without difficulty and Oral airway inserted - appropriate to patient size Laryngoscope Size: McGraph and 4 Grade View: Grade I Tube type: Oral Tube size: 7.5 mm Number of attempts: 1 Airway Equipment and Method: Stylet Placement Confirmation: ETT inserted through vocal cords under direct vision, positive ETCO2 and breath sounds checked- equal and bilateral Secured at: 23 cm Tube secured with: Tape Dental Injury: Teeth and Oropharynx as per pre-operative assessment

## 2021-11-18 NOTE — Discharge Instructions (Addendum)
Hernia repair, Care After This sheet gives you information about how to care for yourself after your procedure. Your health care provider may also give you more specific instructions. If you have problems or questions, contact your health care provider. What can I expect after the procedure? After your procedure, it is common to have the following: Pain in your abdomen, especially in the incision areas. You will be given medicine to control the pain. Tiredness. This is a normal part of the recovery process. Your energy level will return to normal over the next several weeks. Changes in your bowel movements, such as constipation or needing to go more often. Talk with your health care provider about how to manage this. Follow these instructions at home: Medicines  tylenol and advil as needed for discomfort.  Please alternate between the two every four hours as needed for pain.    Use narcotics, if prescribed, only when tylenol and motrin is not enough to control pain.  325-650mg  every 8hrs to max of 3000mg /24hrs (including the 325mg  in every norco dose) for the tylenol.    Advil up to 400mg  per dose every 8hrs as needed for pain.   PLEASE RECORD NUMBER OF PILLS TAKEN UNTIL NEXT FOLLOW UP APPT.  THIS WILL HELP DETERMINE HOW READY YOU ARE TO BE RELEASED FROM ANY ACTIVITY RESTRICTIONS Do not drive or use heavy machinery while taking prescription pain medicine. Do not drink alcohol while taking prescription pain medicine.  Incision care    Follow instructions from your health care provider about how to take care of your incision areas. Make sure you: Keep your incisions clean and dry. Wash your hands with soap and water before and after applying medicine to the areas, and before and after changing your bandage (dressing). If soap and water are not available, use hand sanitizer. Change your dressing as told by your health care provider. Leave stitches (sutures), skin glue, or adhesive strips in  place. These skin closures may need to stay in place for 2 weeks or longer. If adhesive strip edges start to loosen and curl up, you may trim the loose edges. Do not remove adhesive strips completely unless your health care provider tells you to do that. Do not wear tight clothing over the incisions. Tight clothing may rub and irritate the incision areas, which may cause the incisions to open. Do not take baths, swim, or use a hot tub until your health care provider approves. OK TO SHOWER IN 24HRS.   Check your incision area every day for signs of infection. Check for: More redness, swelling, or pain. More fluid or blood. Warmth. Pus or a bad smell. Activity Avoid lifting anything that is heavier than 10 lb (4.5 kg) for 2 weeks or until your health care provider says it is okay. No pushing/pulling greater than 30lbs You may resume normal activities as told by your health care provider. Ask your health care provider what activities are safe for you. Take rest breaks during the day as needed. Eating and drinking Follow instructions from your health care provider about what you can eat after surgery. To prevent or treat constipation while you are taking prescription pain medicine, your health care provider may recommend that you: Drink enough fluid to keep your urine clear or pale yellow. Take over-the-counter or prescription medicines. Eat foods that are high in fiber, such as fresh fruits and vegetables, whole grains, and beans. Limit foods that are high in fat and processed sugars, such as fried and  sweet foods. General instructions Ask your health care provider when you will need an appointment to get your sutures or staples removed. Keep all follow-up visits as told by your health care provider. This is important. Contact a health care provider if: You have more redness, swelling, or pain around your incisions. You have more fluid or blood coming from the incisions. Your incisions feel  warm to the touch. You have pus or a bad smell coming from your incisions or your dressing. You have a fever. You have an incision that breaks open (edges not staying together) after sutures or staples have been removed. You develop a rash. You have chest pain or difficulty breathing. You have pain or swelling in your legs. You feel light-headed or you faint. Your abdomen swells (becomes distended). You have nausea or vomiting. You have blood in your stool (feces). This information is not intended to replace advice given to you by your health care provider. Make sure you discuss any questions you have with your health care provider. Document Released: 05/06/2005 Document Revised: 07/06/2018 Document Reviewed: 07/18/2016 Elsevier Interactive Patient Education  2019 [place]   The drugs that you were given will stay in your system until tomorrow so for the next 24 hours you should not:  Drive an automobile Make any legal decisions Drink any alcoholic beverage   You may resume regular meals tomorrow.  Today it is better to start with liquids and gradually work up to solid foods.  You may eat anything you prefer, but it is better to start with liquids, then soup and crackers, and gradually work up to solid foods.   Please notify your doctor immediately if you have any unusual bleeding, trouble breathing, redness and pain at the surgery site, drainage, fever, or pain not relieved by medication.    Additional Instructions:   Please contact your physician with any problems or Same Day Surgery at [phone], Monday through Friday 6 am to 4 pm, or [place] at [facility] number at [phone].  Please contact your physician with any problems or Same Day Surgery at [phone], Monday through Friday 6 am to 4 pm, or [place] at [facility] number at [phone].

## 2021-11-18 NOTE — Op Note (Signed)
Preoperative diagnosis: right Inguinal Hernia.  Postoperative diagnosis: right indirect  Inguinal Hernia, right groin lymphadenopathy  Procedure:  Open right  Inguinal hernia repair with mesh, right groin lymph node biopsy  Anesthesia: General, LMA  Surgeon: Dr. Lysle Pearl  Wound Classification: Clean  Specimen: none  Complications: None  Estimated Blood Loss: 26mL   Indications:  Patient is a 76 y.o. male developed a symptomatic right  inguinal hernia. Repair was indicated to avoid complications of incarceration, obstruction and pain, and a prosthetic mesh repair was elected.  Findings: 1. Vas Deferens and cord structures identified and preserved 2. Progrip mesh used for repair 3. Adequate hemostasis achieved 4. Incidental right groin lymph node palpated and biopsied due to proximity to area of tenderness  Description of procedure: The patient was taken to the operating room. A time-out was completed verifying correct patient, procedure, site, positioning, and implant(s) and/or special equipment prior to beginning this procedure. The right  groin was prepped and draped in the usual sterile fashion. An incision was marked in a natural skin crease and planned to end near the pubic tubercle.  The skin crease incision was made with a knife and deepened through Scarpas and Campers fascia with electrocautery until the aponeurosis of the external oblique was encountered. This was cleaned and the external ring was exposed. Immediately distal to this area was a palpable and enlarged lymph node in the subcutaneous layer.  This was biopsied and sent for pathology. Hemostasis was achieved in the wound. An incision was made in the midportion of the external oblique aponeurosis in the direction of its fibers. Flaps of the external oblique were developed cephalad and inferiorly.  The cord was identified. It was gently dissected free at the pubic tubercle and encircled with a Penrose drain. Attention was  directed to the anteromedial aspect of the cord, where an indirect hernia sac was identified. The small sac was carefully dissected free of the cord down to the level of the internal ring and reduced back into abdominal cavity along with an adjacent cord lipoma. The vas and testicular vessels were identified and protected from harm.   Attention then turned to the floor of the canal, which was intact. The Progrip mesh was inserted and secured to the pubic tubercle using interrupted 0 ethibond sutures. Care was taken to assure that the mesh was placed flat against the floor and wrapped loosely around the cord structures.  Hemostasis was again checked. The Penrose drain was removed.   The external oblique aponeurosis was closed with a running suture of 2-0 Vicryl, taking care not to catch the ilioinguinal nerve in the suture line. Zynrelef infused into area and Scarpas fascia was closed with interrupted 3-0 Vicryl.  The deep dermal layer closed with interrupted 3-0 Vicryl.  The skin was closed with a subcuticular stitch of Monocryl 4-0. Dermabond was applied.  The testis was gently pulled down into its anatomic position in the scrotum.  The patient tolerated the procedure well and was taken to the postanesthesia care unit in stable condition. Sponge and instrument count correct at end of procedure.

## 2021-11-18 NOTE — Transfer of Care (Signed)
Immediate Anesthesia Transfer of Care Note  Patient: Carl Mcclain  Procedure(s) Performed: HERNIA REPAIR INGUINAL ADULT (Right: Groin) INSERTION OF MESH  Patient Location: PACU  Anesthesia Type:General  Level of Consciousness: drowsy  Airway & Oxygen Therapy: Patient Spontanous Breathing and Patient connected to face mask oxygen  Post-op Assessment: Report given to RN and Post -op Vital signs reviewed and stable  Post vital signs: Reviewed and stable  Last Vitals:  Vitals Value Taken Time  BP 127/77 11/18/21 1015  Temp 36.6 C 11/18/21 1010  Pulse 87 11/18/21 1017  Resp 12 11/18/21 1017  SpO2 100 % 11/18/21 1017  Vitals shown include unvalidated device data.  Last Pain:  Vitals:   11/18/21 1010  TempSrc:   PainSc: Asleep         Complications: No notable events documented.

## 2021-11-18 NOTE — Interval H&P Note (Signed)
History and Physical Interval Note:  11/18/2021 8:22 AM  Carl Mcclain  has presented today for surgery, with the diagnosis of K40.90 Unilateral inguinal hernia.  The various methods of treatment have been discussed with the patient and family. After consideration of risks, benefits and other options for treatment, the patient has consented to  Procedure(s): HERNIA REPAIR INGUINAL ADULT (Right) as a surgical intervention.  The patient's history has been reviewed, patient examined, no change in status, stable for surgery.  I have reviewed the patient's chart and labs.  Questions were answered to the patient's satisfaction.     [name] Carl Mcclain

## 2021-11-18 NOTE — Anesthesia Preprocedure Evaluation (Addendum)
Anesthesia Evaluation  Patient identified by MRN, date of birth, ID band Patient awake    Reviewed: Allergy & Precautions, NPO status , Patient's Chart, lab work & pertinent test results  History of Anesthesia Complications Negative for: history of anesthetic complications  Airway Mallampati: III  TM Distance: >3 FB Neck ROM: full    Dental  (+) Chipped, Poor Dentition, Missing   Pulmonary shortness of breath and with exertion, asthma , pneumonia, unresolved, COPD,  COPD inhaler, former smoker,    + rhonchi  + decreased breath sounds      Cardiovascular Exercise Tolerance: Good (-) angina(-) Past MI and (-) DOE Normal cardiovascular exam     Neuro/Psych negative neurological ROS  negative psych ROS   GI/Hepatic negative GI ROS, Neg liver ROS,   Endo/Other  negative endocrine ROS  Renal/GU      Musculoskeletal   Abdominal   Peds  Hematology negative hematology ROS (+)   Anesthesia Other Findings Past Medical History: No date: Asthma No date: Cancer ([place]) No date: COPD (chronic obstructive pulmonary disease) ([facility]) No date: Dyspnea No date: Parkinson disease ([place]) No date: Pneumonia  Past Surgical History: No date: prostatectomy No date: Vastectomy No date: VEIN LIGATION AND STRIPPING  BMI    Body Mass Index: 20.34 kg/m      Reproductive/Obstetrics negative OB ROS                           Anesthesia Physical Anesthesia Plan  ASA: 3  Anesthesia Plan: General ETT   Post-op Pain Management:    Induction: Intravenous  PONV Risk Score and Plan: Ondansetron, Dexamethasone, Midazolam and Treatment may vary due to age or medical condition  Airway Management Planned: Oral ETT  Additional Equipment:   Intra-op Plan:   Post-operative Plan: Extubation in OR  Informed Consent: I have reviewed the patients History and Physical, chart, labs and discussed the procedure including  the risks, benefits and alternatives for the proposed anesthesia with the patient or authorized representative who has indicated his/her understanding and acceptance.     Dental Advisory Given  Plan Discussed with: Anesthesiologist, CRNA and Surgeon  Anesthesia Plan Comments: (Patient consented for risks of anesthesia including but not limited to:  - adverse reactions to medications - damage to eyes, teeth, lips or other oral mucosa - nerve damage due to positioning  - sore throat or hoarseness - Damage to heart, brain, nerves, lungs, other parts of body or loss of life  Patient voiced understanding.)        Anesthesia Quick Evaluation

## 2021-11-19 LAB — SURGICAL PATHOLOGY

## 2021-11-30 NOTE — Anesthesia Postprocedure Evaluation (Signed)
Anesthesia Post Note  Patient: Carl Mcclain  Procedure(s) Performed: HERNIA REPAIR INGUINAL ADULT (Right: Groin) INSERTION OF MESH  Patient location during evaluation: PACU Anesthesia Type: General Level of consciousness: awake and alert Pain management: pain level controlled Vital Signs Assessment: post-procedure vital signs reviewed and stable Respiratory status: spontaneous breathing, nonlabored ventilation, respiratory function stable and patient connected to nasal cannula oxygen Cardiovascular status: blood pressure returned to baseline and stable Postop Assessment: no apparent nausea or vomiting Anesthetic complications: no   No notable events documented.   Last Vitals:  Vitals:   11/18/21 1122 11/18/21 1200  BP: 138/86 (!) 152/78  Pulse: 82 70  Resp: 16 16  Temp: (!) 36.1 C   SpO2: 98% 98%    Last Pain:  Vitals:   11/19/21 0846  TempSrc:   PainSc: 0-No pain                 Molli Barrows

## 2022-02-21 ENCOUNTER — Emergency Department
Admission: EM | Admit: 2022-02-21 | Discharge: 2022-02-21 | Disposition: A | Discharge status: Home / Self Care | Payer: Medicare Other | Source: Home / Self Care

## 2022-02-21 ENCOUNTER — Other Ambulatory Visit: Payer: Self-pay

## 2022-02-21 DIAGNOSIS — Z5321 Procedure and treatment not carried out due to patient leaving prior to being seen by health care provider: Secondary | ICD-10-CM | POA: Insufficient documentation

## 2022-02-21 DIAGNOSIS — F419 Anxiety disorder, unspecified: Secondary | ICD-10-CM | POA: Insufficient documentation

## 2022-02-21 DIAGNOSIS — R002 Palpitations: Secondary | ICD-10-CM | POA: Insufficient documentation

## 2022-02-21 DIAGNOSIS — Z760 Encounter for issue of repeat prescription: Secondary | ICD-10-CM | POA: Insufficient documentation

## 2022-02-21 DIAGNOSIS — I5041 Acute combined systolic (congestive) and diastolic (congestive) heart failure: Secondary | ICD-10-CM | POA: Diagnosis not present

## 2022-02-21 NOTE — ED Triage Notes (Signed)
Pt to ED because states has been having ups and downs with anxiety and is about to run out of prescription Xanax. Is afraid that if he runs out he won't be able to deal with anxiety if it becomes worse. ? ?Has PD, and is dealing with major life decisions.  ? ?States that anxiety sometimes causes him to hyperventilate and feel heart palpitations. Pt denies CP and SOB at this time. ?

## 2022-02-23 ENCOUNTER — Emergency Department: Payer: Medicare Other

## 2022-02-23 ENCOUNTER — Encounter: Payer: Self-pay | Admitting: Radiology

## 2022-02-23 ENCOUNTER — Other Ambulatory Visit: Payer: Self-pay

## 2022-02-23 ENCOUNTER — Inpatient Hospital Stay
Admission: EM | Admit: 2022-02-23 | Discharge: 2022-02-26 | DRG: 291 | Disposition: A | Discharge status: Home / Self Care | Payer: Medicare Other | Attending: Hospitalist | Admitting: Hospitalist

## 2022-02-23 DIAGNOSIS — R7989 Other specified abnormal findings of blood chemistry: Secondary | ICD-10-CM

## 2022-02-23 DIAGNOSIS — G4700 Insomnia, unspecified: Secondary | ICD-10-CM

## 2022-02-23 DIAGNOSIS — I959 Hypotension, unspecified: Secondary | ICD-10-CM | POA: Diagnosis not present

## 2022-02-23 DIAGNOSIS — J449 Chronic obstructive pulmonary disease, unspecified: Secondary | ICD-10-CM

## 2022-02-23 DIAGNOSIS — G4733 Obstructive sleep apnea (adult) (pediatric): Secondary | ICD-10-CM | POA: Diagnosis present

## 2022-02-23 DIAGNOSIS — R002 Palpitations: Secondary | ICD-10-CM | POA: Diagnosis present

## 2022-02-23 DIAGNOSIS — I700 Atherosclerosis of aorta: Secondary | ICD-10-CM | POA: Diagnosis present

## 2022-02-23 DIAGNOSIS — I248 Other forms of acute ischemic heart disease: Secondary | ICD-10-CM | POA: Diagnosis not present

## 2022-02-23 DIAGNOSIS — I5041 Acute combined systolic (congestive) and diastolic (congestive) heart failure: Principal | ICD-10-CM | POA: Diagnosis present

## 2022-02-23 DIAGNOSIS — N179 Acute kidney failure, unspecified: Secondary | ICD-10-CM | POA: Diagnosis not present

## 2022-02-23 DIAGNOSIS — G20C Parkinsonism, unspecified: Secondary | ICD-10-CM

## 2022-02-23 DIAGNOSIS — K761 Chronic passive congestion of liver: Secondary | ICD-10-CM | POA: Diagnosis present

## 2022-02-23 DIAGNOSIS — I4510 Unspecified right bundle-branch block: Secondary | ICD-10-CM | POA: Diagnosis present

## 2022-02-23 DIAGNOSIS — G9341 Metabolic encephalopathy: Secondary | ICD-10-CM

## 2022-02-23 DIAGNOSIS — Z79899 Other long term (current) drug therapy: Secondary | ICD-10-CM

## 2022-02-23 DIAGNOSIS — F05 Delirium due to known physiological condition: Secondary | ICD-10-CM | POA: Diagnosis present

## 2022-02-23 DIAGNOSIS — G20 Parkinson's disease: Secondary | ICD-10-CM | POA: Diagnosis present

## 2022-02-23 DIAGNOSIS — F419 Anxiety disorder, unspecified: Secondary | ICD-10-CM

## 2022-02-23 DIAGNOSIS — Z8546 Personal history of malignant neoplasm of prostate: Secondary | ICD-10-CM

## 2022-02-23 DIAGNOSIS — Z87891 Personal history of nicotine dependence: Secondary | ICD-10-CM

## 2022-02-23 DIAGNOSIS — T502X5A Adverse effect of carbonic-anhydrase inhibitors, benzothiadiazides and other diuretics, initial encounter: Secondary | ICD-10-CM | POA: Diagnosis not present

## 2022-02-23 DIAGNOSIS — R778 Other specified abnormalities of plasma proteins: Secondary | ICD-10-CM

## 2022-02-23 DIAGNOSIS — I081 Rheumatic disorders of both mitral and tricuspid valves: Secondary | ICD-10-CM | POA: Diagnosis present

## 2022-02-23 DIAGNOSIS — I509 Heart failure, unspecified: Principal | ICD-10-CM

## 2022-02-23 DIAGNOSIS — Z9079 Acquired absence of other genital organ(s): Secondary | ICD-10-CM

## 2022-02-23 HISTORY — DX: Obstructive sleep apnea (adult) (pediatric): G47.33

## 2022-02-23 LAB — HEPATIC FUNCTION PANEL
ALT: 6 U/L (ref 0–44)
AST: 33 U/L (ref 15–41)
Albumin: 4.3 g/dL (ref 3.5–5.0)
Alkaline Phosphatase: 54 U/L (ref 38–126)
Bilirubin, Direct: 0.3 mg/dL — ABNORMAL HIGH (ref 0.0–0.2)
Indirect Bilirubin: 1.6 mg/dL — ABNORMAL HIGH (ref 0.3–0.9)
Total Bilirubin: 1.9 mg/dL — ABNORMAL HIGH (ref 0.3–1.2)
Total Protein: 7.2 g/dL (ref 6.5–8.1)

## 2022-02-23 LAB — CBC
HCT: 43.8 % (ref 39.0–52.0)
Hemoglobin: 14.3 g/dL (ref 13.0–17.0)
MCH: 31.1 pg (ref 26.0–34.0)
MCHC: 32.6 g/dL (ref 30.0–36.0)
MCV: 95.2 fL (ref 80.0–100.0)
Platelets: 171 10*3/uL (ref 150–400)
RBC: 4.6 MIL/uL (ref 4.22–5.81)
RDW: 13.3 % (ref 11.5–15.5)
WBC: 6.8 10*3/uL (ref 4.0–10.5)
nRBC: 0 % (ref 0.0–0.2)

## 2022-02-23 LAB — BASIC METABOLIC PANEL
Anion gap: 8 (ref 5–15)
BUN: 26 mg/dL — ABNORMAL HIGH (ref 8–23)
CO2: 26 mmol/L (ref 22–32)
Calcium: 9.1 mg/dL (ref 8.9–10.3)
Chloride: 99 mmol/L (ref 98–111)
Creatinine, Ser: 1.3 mg/dL — ABNORMAL HIGH (ref 0.61–1.24)
GFR, Estimated: 57 mL/min — ABNORMAL LOW (ref >60)
Glucose, Bld: 101 mg/dL — ABNORMAL HIGH (ref 70–99)
Potassium: 4.2 mmol/L (ref 3.5–5.1)
Sodium: 133 mmol/L — ABNORMAL LOW (ref 135–145)

## 2022-02-23 LAB — URINALYSIS, ROUTINE W REFLEX MICROSCOPIC
Bacteria, UA: NONE SEEN
Bilirubin Urine: NEGATIVE
Glucose, UA: NEGATIVE mg/dL
Ketones, ur: 5 mg/dL — AB
Leukocytes,Ua: NEGATIVE
Nitrite: NEGATIVE
Protein, ur: NEGATIVE mg/dL
Specific Gravity, Urine: 1.02 (ref 1.005–1.030)
Squamous Epithelial / HPF: NONE SEEN (ref 0–5)
WBC, UA: NONE SEEN WBC/hpf (ref 0–5)
pH: 6 (ref 5.0–8.0)

## 2022-02-23 LAB — TSH: TSH: 1.7 u[IU]/mL (ref 0.350–4.500)

## 2022-02-23 LAB — TROPONIN I (HIGH SENSITIVITY)
Troponin I (High Sensitivity): 21 ng/L — ABNORMAL HIGH (ref <18)
Troponin I (High Sensitivity): 23 ng/L — ABNORMAL HIGH (ref <18)

## 2022-02-23 LAB — BRAIN NATRIURETIC PEPTIDE: B Natriuretic Peptide: 903.5 pg/mL — ABNORMAL HIGH (ref 0.0–100.0)

## 2022-02-23 LAB — MAGNESIUM: Magnesium: 2.2 mg/dL (ref 1.7–2.4)

## 2022-02-23 MED ORDER — LACTATED RINGERS IV BOLUS
1000.0000 mL | Freq: Once | INTRAVENOUS | Status: AC
Start: 2022-02-23 — End: 2022-02-23
  Administered 2022-02-23: 1000 mL via INTRAVENOUS

## 2022-02-23 MED ORDER — TRAZODONE HCL 50 MG PO TABS
25.0000 mg | ORAL_TABLET | Freq: Every day | ORAL | Status: DC
  Administered 2022-02-23 – 2022-02-24 (×2): 25 mg via ORAL
  Filled 2022-02-23 (×2): qty 1

## 2022-02-23 MED ORDER — FUROSEMIDE 10 MG/ML IJ SOLN
20.0000 mg | Freq: Two times a day (BID) | INTRAMUSCULAR | Status: DC
  Administered 2022-02-24 – 2022-02-26 (×4): 20 mg via INTRAVENOUS
  Filled 2022-02-23 (×3): qty 2
  Filled 2022-02-23: qty 4

## 2022-02-23 MED ORDER — IOHEXOL 350 MG/ML SOLN
75.0000 mL | Freq: Once | INTRAVENOUS | Status: AC | PRN
  Administered 2022-02-23: 75 mL via INTRAVENOUS

## 2022-02-23 MED ORDER — HYDROCODONE-ACETAMINOPHEN 5-325 MG PO TABS: 1.0000 | ORAL_TABLET | ORAL | Status: DC | PRN

## 2022-02-23 MED ORDER — GABAPENTIN 100 MG PO CAPS
100.0000 mg | ORAL_CAPSULE | Freq: Two times a day (BID) | ORAL | Status: DC
  Administered 2022-02-23 – 2022-02-26 (×5): 100 mg via ORAL
  Filled 2022-02-23 (×6): qty 1

## 2022-02-23 MED ORDER — ACETAMINOPHEN 650 MG RE SUPP: 650.0000 mg | Freq: Four times a day (QID) | RECTAL | Status: DC | PRN

## 2022-02-23 MED ORDER — ACETAMINOPHEN 325 MG PO TABS: 650.0000 mg | ORAL_TABLET | Freq: Four times a day (QID) | ORAL | Status: DC | PRN

## 2022-02-23 MED ORDER — ONDANSETRON HCL 4 MG/2ML IJ SOLN: 4.0000 mg | Freq: Four times a day (QID) | INTRAMUSCULAR | Status: DC | PRN

## 2022-02-23 MED ORDER — CARBIDOPA-LEVODOPA ER 25-100 MG PO TBCR
1.0000 | EXTENDED_RELEASE_TABLET | Freq: Four times a day (QID) | ORAL | Status: DC
  Administered 2022-02-23 – 2022-02-26 (×7): 1 via ORAL
  Filled 2022-02-23 (×9): qty 1

## 2022-02-23 MED ORDER — FUROSEMIDE 10 MG/ML IJ SOLN
40.0000 mg | Freq: Once | INTRAMUSCULAR | Status: AC
  Administered 2022-02-23: 40 mg via INTRAVENOUS
  Filled 2022-02-23: qty 4

## 2022-02-23 MED ORDER — CARBIDOPA-LEVODOPA 25-100 MG PO TABS
2.0000 | ORAL_TABLET | Freq: Three times a day (TID) | ORAL | Status: DC
  Administered 2022-02-24 – 2022-02-26 (×6): 2 via ORAL
  Filled 2022-02-23 (×9): qty 2

## 2022-02-23 MED ORDER — ENOXAPARIN SODIUM 40 MG/0.4ML IJ SOSY
40.0000 mg | PREFILLED_SYRINGE | INTRAMUSCULAR | Status: DC
  Administered 2022-02-23 – 2022-02-25 (×3): 40 mg via SUBCUTANEOUS
  Filled 2022-02-23 (×3): qty 0.4

## 2022-02-23 MED ORDER — CARBIDOPA-LEVODOPA 25-100 MG PO TABS
1.0000 | ORAL_TABLET | Freq: Every day | ORAL | Status: DC
  Administered 2022-02-24 – 2022-02-25 (×2): 1 via ORAL
  Filled 2022-02-23 (×2): qty 1

## 2022-02-23 MED ORDER — ALBUTEROL SULFATE (2.5 MG/3ML) 0.083% IN NEBU: 2.5000 mg | INHALATION_SOLUTION | Freq: Four times a day (QID) | RESPIRATORY_TRACT | Status: DC | PRN

## 2022-02-23 MED ORDER — ONDANSETRON HCL 4 MG PO TABS: 4.0000 mg | ORAL_TABLET | Freq: Four times a day (QID) | ORAL | Status: DC | PRN

## 2022-02-23 NOTE — Assessment & Plan Note (Signed)
Stable.  Not acutely exacerbated.  DuoNebs as needed ?

## 2022-02-23 NOTE — Assessment & Plan Note (Addendum)
Seen by neurologist, Dr. Melrose Nakayama in February.  Note reviewed.  Currently being weaned off Sinemet ?Per neurologist 12/2021: ?- Patient with history of Parkinsonism having ongoing fatigue, difficulty sleeping, and anxiety.  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  ?- Continue Sinemet 25/100 mg two tablets four times daily. ?- Continue Sinemet CR 25/100 mg one tablet four times daily.  ?- Start Gabapentin (Neurontin) 100 mg twice daily for one week, then increase to 200 mg twice daily.  ?- Start Trazodone 50 mg 1-2 pills nightly for sleep. Take one pill initially. Increase to 2 pills after one week if this dose not help. Decrease to half pill if having too much drowsiness in the AM.  ?- Will consider increasing night time dose of Gabapentin at next visit for sleep difficulty.  ? ?Medications previously tried: ?Propranolol (2013, reported worsened asthma symptoms)  ? ?

## 2022-02-23 NOTE — Assessment & Plan Note (Addendum)
Patient with sinus tachycardia and etiology uncertain.   ?Acute PE ruled out ?Possible CHF: Noted to have cardiomegaly with elevated troponin, BNP and liver enzymes ?Prudent to do cardiac work-up with cardiac monitoring and echocardiogram ?Continue home anxiety meds as this may have to do with anxiety. ?

## 2022-02-23 NOTE — Assessment & Plan Note (Signed)
S/p prostatectomy. No acute issues suspected ?

## 2022-02-23 NOTE — ED Triage Notes (Signed)
First Nurse Note:  Arrives c/o weakness, feeling shaky, a strange feeling around mouth, and unable to stay still.  States he feels like he is having an anxiety attack.  States symptoms have been on going on for two day.s ? ?Patient is AAOx3.  Skin warm and dry. Ambulates with easy and steady gait.  MAE equally and strong.  NAD ?

## 2022-02-23 NOTE — H&P (Addendum)
?History and Physical  ? ? ?Patient: Carl Mcclain ZOX:[id number] DOB: 06-28-1946 ?DOA: 02/23/2022 ?DOS: the patient was seen and examined on 02/23/2022 ?PCP: Leonel Ramsay, MD  ?Patient coming from: Home ? ?Chief Complaint:  ?Chief Complaint  ?Patient presents with  ? Weakness  ? Anxiety  ? ? ?HPI: Carl Mcclain is a 76 y.o. male with medical history significant for COPD, anxiety and insomnia, history of prostate cancer s/p prostatectomy, Parkinsonism,  followed by neurologist Dr. Melrose Nakayama, last seen in February 2023 (note reviewed) who presents to the ED with a 2-day history of weakness, feeling shaky and unable to sit still feeling like he is having an anxiety attack.  He also has episodes where he feels like his heart is racing and he has difficulty catching his breath.  He also reports ongoing difficulty sleeping. ?ED course: BP 155/95, pulse 108 with otherwise normal vitals.  Blood work notable for troponin 23, BNP 903, total bilirubin 1.9, indirect 1.6 and direct 0.3.  TSH 1.7.  Potassium and magnesium within normal limits.  Otherwise unremarkable.  Urinalysis benign.  EKG, personally viewed and interpreted showed NSR at 98 with a right bundle branch block and no acute ST-T wave changes.  Chest x-ray shows mild cardiomegaly without overt edema.  CTA chest shows cardiomegaly without edema and no evidence of PE. ?Patient treated with a dose of Lasix after initially given a fluid bolus.  Hospitalist consulted for admission for work-up of possible new onset CHF.  ? ?Review of Systems: As mentioned in the history of present illness. All other systems reviewed and are negative. ?Past Medical History:  ?Diagnosis Date  ? Asthma   ? Cancer [facility])   ? COPD (chronic obstructive pulmonary disease) ([place])   ? Dyspnea   ? Parkinson disease ([place])   ? Pneumonia   ? ?Past Surgical History:  ?Procedure Laterality Date  ? INGUINAL HERNIA REPAIR Right 11/18/2021  ? Procedure: HERNIA REPAIR INGUINAL ADULT;  Surgeon: Benjamine Sprague,  DO;  Location: [facility] ORS;  Service: General;  Laterality: Right;  ? INSERTION OF MESH  11/18/2021  ? Procedure: INSERTION OF MESH;  Surgeon: Benjamine Sprague, DO;  Location: [facility] ORS;  Service: General;;  ? prostatectomy    ? Vastectomy    ? VEIN LIGATION AND STRIPPING    ? ?Social History:  reports that he has quit smoking. His smoking use included cigarettes. He has never used smokeless tobacco. He reports that he does not drink alcohol and does not use drugs. ? ?No Known Allergies ? ?No family history on file. ? ?Prior to Admission medications   ?Medication Sig Start Date End Date Taking? Authorizing Provider  ?albuterol (VENTOLIN HFA) 108 (90 Base) MCG/ACT inhaler Inhale 2 puffs into the lungs every 6 (six) hours as needed for wheezing or shortness of breath.    [provider]  ?carbidopa-levodopa (SINEMET IR) 25-100 MG tablet Take 1-2 tablets by mouth See admin instructions. Take 2 tablets at 0600, take 2 tablets at 1000, 2 tablets at 1400, and 1 tablet 1800    [provider]  ?Carbidopa-Levodopa ER (SINEMET CR) 25-100 MG tablet controlled release Take 1 tablet by mouth 4 (four) times daily. 09/16/21   [provider]  ?gabapentin (NEURONTIN) 100 MG capsule Take 100 mg by mouth 2 (two) times daily.    [provider]  ?ibuprofen (ADVIL) 200 MG tablet Take 200 mg by mouth daily as needed for moderate pain.    [provider]  ?Tiotropium Bromide Monohydrate (SPIRIVA  RESPIMAT) 2.5 MCG/ACT AERS Inhale 2 puffs into the lungs daily.    [provider]  ?traZODone (DESYREL) 50 MG tablet Take 25 mg by mouth at bedtime.    [provider]  ? ? ?Physical Exam: ?Vitals:  ? 02/23/22 1640 02/23/22 1839 02/23/22 1930 02/23/22 2025  ?BP: (!) 160/95 (!) 150/84 (!) 155/95 (!) 141/94  ?Pulse: 99 98 (!) 108 98  ?Resp: '20 20  15  '$ ?Temp: 97.7 ?F (36.5 ?C)     ?TempSrc: Oral     ?SpO2: 99% 100% 99% 96%  ? ?Physical Exam ?Vitals and nursing note reviewed.  ?Constitutional:    ?   General: He is not in acute distress. ?   Comments: Restless, sitting and standing throughout the interview.  Bed alarm going off throughout.  ?HENT:  ?   Head: Normocephalic and atraumatic.  ?Cardiovascular:  ?   Rate and Rhythm: Normal rate and regular rhythm.  ?   Pulses: Normal pulses.  ?   Heart sounds: Normal heart sounds.  ?Pulmonary:  ?   Effort: Pulmonary effort is normal.  ?   Breath sounds: Normal breath sounds.  ?Abdominal:  ?   Palpations: Abdomen is soft.  ?   Tenderness: There is no abdominal tenderness.  ?Neurological:  ?   General: No focal deficit present.  ?   Comments:  ?  ? ? ? ?Data Reviewed: ?Relevant notes from primary care and specialist visits, past discharge summaries as available in EHR, including Care Everywhere. ?Prior diagnostic testing as pertinent to current admission diagnoses ?Updated medications and problem lists for reconciliation ?ED course, including vitals, labs, imaging, treatment and response to treatment ?Triage notes, nursing and pharmacy notes and ED provider's notes ?Notable results as noted in HPI ? ? ?Assessment and Plan: ?* Palpitations ?Patient with sinus tachycardia and etiology uncertain.   ?Acute PE ruled out ?Possible CHF: Noted to have cardiomegaly with elevated troponin, BNP and liver enzymes ?Prudent to do cardiac work-up with cardiac monitoring and echocardiogram ?Continue home anxiety meds as this may have to do with anxiety. ? ?Elevated troponin ?No complaints of chest pain and EKG nonacute ?Possibly demand ischemia from tachycardia, versus possibility of CHF given elevated BNP and cardiomegaly ?We will continue to trend ?We will give low-dose diuretics ?Daily weights with intake and output monitoring ? ?Hyperbilirubinemia ?Possible hepatic congestion related to possible CHF ?Follow-up echocardiogram. ? ?Elevated serum creatinine ?Suspect mild dehydration.  Creatinine 1.3, up from baseline of 1.05 ?Patient received an IV fluid bolus in the ED ?Continue  to monitor creatinine given low-dose IV Lasix treatment for possible mild CHF ? ?Anxiety ?Continue trazodone ? ?Parkinsonism ([place]) ?Seen by neurologist, Dr. Melrose Nakayama in February.  Note reviewed.  Currently being weaned off Sinemet ?Per neurologist 12/2021: ?- Patient with history of Parkinsonism having ongoing fatigue, difficulty sleeping, and anxiety.  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  ?- Continue Sinemet 25/100 mg two tablets four times daily. ?- Continue Sinemet CR 25/100 mg one tablet four times daily.  ?- Start Gabapentin (Neurontin) 100 mg twice daily for one week, then increase to 200 mg twice daily.  ?- Start Trazodone 50 mg 1-2 pills nightly for sleep. Take one pill initially. Increase to 2 pills after one week if this dose not help. Decrease to half pill if having too much drowsiness in the AM.  ?- Will consider increasing night time dose of Gabapentin at next visit for sleep difficulty.  ? ?  Medications previously tried: ?Propranolol (2013, reported worsened asthma symptoms)  ? ? ?History of prostate cancer ?S/p prostatectomy. No acute issues suspected ? ?Insomnia ?Continue trazodone ? ?COPD (chronic obstructive pulmonary disease) ([place]) ?Stable.  Not acutely exacerbated.  DuoNebs as needed ? ? ? ? ? ? ?Advance Care Planning:   Code Status: Not on file  ? ?Consults: none ? ?Family Communication: none ? ?Severity of Illness: ?The appropriate patient status for this patient is OBSERVATION. Observation status is judged to be reasonable and necessary in order to provide the required intensity of service to ensure the patient's safety. The patient's presenting symptoms, physical exam findings, and initial radiographic and laboratory data in the context of their medical condition is felt to place them at decreased risk for further clinical deterioration. Furthermore, it is anticipated that the patient will be medically stable for discharge from the  hospital within 2 midnights of admission.  ? ?Author: ?Athena Masse, MD ?02/23/2022 8:38 PM ? ?For on call review [email or web address].  ?

## 2022-02-23 NOTE — Assessment & Plan Note (Signed)
Suspect mild dehydration.  Creatinine 1.3, up from baseline of 1.05 ?Patient received an IV fluid bolus in the ED ?Continue to monitor creatinine given low-dose IV Lasix treatment for possible mild CHF ?

## 2022-02-23 NOTE — ED Provider Notes (Signed)
? ?[facility] ?Provider Note ? ? ? Event Date/Time  ? First MD Initiated Contact with Patient 02/23/22 1808   ?  (approximate) ? ? ?History  ? ?Weakness and Anxiety ? ? ?HPI ? ?Carl Mcclain is a 76 y.o. male right ankle hernia s/p repair 1/19,, anxiety, tremor, chronic fatigue and parkinsonism versus benign tremor followed by neurology with most recent visit note reviewed from 2/22 with patient planning to decrease his Sinemet who presents for evaluation accompanied by spouse for what he describes as episodes where he feels like his heart is racing and he cannot catch his breath and is feels extremely anxious.  States this has been going on for over a month and that will last several hours at a time.  Other than stress he cannot identify any precipitant factors.  He cannot identify any specific factors.  States he strongly disagrees with neurologist and he states he believes he has Parkinson's.  He states he has had very poor sleep and he is taking trazodone and has not slept in a couple days which is not unusual for him.  He denies any recent fevers, cough, abdominal pain, nausea, vomiting, diarrhea, rash or extremity weakness numbness or tingling.  Denies any tobacco abuse currently although does endorse remote tobacco abuse.  Denies any recent EtOH or illicit drug use.  Does drink about 2 cups of coffee per day.  Symptoms are not particularly different today that happened over the last month although he feels like they are not getting better.  States he had an episode that started last night and is feeling a little better now but still feels like his heart is racing.  Denies any other acute complaints.  States he almost feels back to baseline. ?  ? ? ?Physical Exam  ?Triage Vital Signs: ?ED Triage Vitals  ?Enc Vitals Group  ?   BP 02/23/22 1640 (!) 160/95  ?   Pulse Rate 02/23/22 1640 99  ?   Resp 02/23/22 1640 20  ?   Temp 02/23/22 1640 97.7 ?F (36.5 ?C)  ?   Temp Source 02/23/22 1640  Oral  ?   SpO2 02/23/22 1640 99 %  ?   Weight --   ?   Height --   ?   Head Circumference --   ?   Peak Flow --   ?   Pain Score 02/23/22 1641 0  ?   Pain Loc --   ?   Pain Edu? --   ?   Excl. in [place]? --   ? ? ?Most recent vital signs: ?Vitals:  ? 02/23/22 1839 02/23/22 1930  ?BP: (!) 150/84 (!) 155/95  ?Pulse: 98 (!) 108  ?Resp: 20   ?Temp:    ?SpO2: 100% 99%  ? ? ?General: Awake, no distress.  ?CV:  Good peripheral perfusion.  2+ radial pulse. ?Resp:  Normal effort.  Clear bilaterally ?Abd:  No distention.  Soft throughout ?Other:  Cranial nerves II through XII grossly intact.  No pronator drift.  No finger dysmetria.  Symmetric 5/5 strength of all extremities.  Sensation intact to light touch in all extremities.  Unremarkable unassisted gait.  Patient is alert and oriented.  He does not appear psychotic and does not have pressured speech. ? ? ? ?ED Results / Procedures / Treatments  ?Labs ?(all labs ordered are listed, but only abnormal results are displayed) ?Labs Reviewed  ?BASIC METABOLIC PANEL - Abnormal; Notable for the following components:  ?  Result Value  ? Sodium 133 (*)   ? Glucose, Bld 101 (*)   ? BUN 26 (*)   ? Creatinine, Ser 1.30 (*)   ? GFR, Estimated 57 (*)   ? All other components within normal limits  ?URINALYSIS, ROUTINE W REFLEX MICROSCOPIC - Abnormal; Notable for the following components:  ? Color, Urine YELLOW (*)   ? APPearance CLEAR (*)   ? Hgb urine dipstick SMALL (*)   ? Ketones, ur 5 (*)   ? All other components within normal limits  ?HEPATIC FUNCTION PANEL - Abnormal; Notable for the following components:  ? Total Bilirubin 1.9 (*)   ? Bilirubin, Direct 0.3 (*)   ? Indirect Bilirubin 1.6 (*)   ? All other components within normal limits  ?BRAIN NATRIURETIC PEPTIDE - Abnormal; Notable for the following components:  ? B Natriuretic Peptide 903.5 (*)   ? All other components within normal limits  ?TROPONIN I (HIGH SENSITIVITY) - Abnormal; Notable for the following components:  ?  Troponin I (High Sensitivity) 21 (*)   ? All other components within normal limits  ?TROPONIN I (HIGH SENSITIVITY) - Abnormal; Notable for the following components:  ? Troponin I (High Sensitivity) 23 (*)   ? All other components within normal limits  ?CBC  ?TSH  ?MAGNESIUM  ?CBG MONITORING, ED  ? ? ? ?EKG ? ?EKG is remarkable for sinus rhythm with a ventricular rate of 98, right bundle branch block currently deep T wave inversion and ST depression in lead III and aVF as well as V6 without other clear evidence of acute ischemia. ? ? ?RADIOLOGY ? ?Chest x-ray my interpretation shows some mild cardiomegaly without overt edema, infarct, effusion, focal consolidation or other clear acute process.  I also viewed radiology interpretation. ? ? ?CTA chest my interpretation without evidence of a large PE, pneumonia, effusion does show cardiomegaly without clear edema.  There is also evidence of aortic atherosclerosis.  I reviewed radiology interpretation and agree with the findings as well. ? ?PROCEDURES: ? ?Critical Care performed: No ? ?.1-3 Lead EKG Interpretation ?Performed by: Lucrezia Starch, MD ?Authorized by: Lucrezia Starch, MD  ? ?  Interpretation: non-specific   ?  ECG rate assessment: tachycardic   ?  Rhythm: sinus rhythm   ?  Ectopy: none   ?  Conduction: normal   ? ?The patient is on the cardiac monitor to evaluate for evidence of arrhythmia and/or significant heart rate changes. ? ? ?MEDICATIONS ORDERED IN ED: ?Medications  ?furosemide (LASIX) injection 40 mg (has no administration in time range)  ?lactated ringers bolus 1,000 mL (0 mLs Intravenous Stopped 02/23/22 2001)  ?iohexol (OMNIPAQUE) 350 MG/ML injection 75 mL (75 mLs Intravenous Contrast Given 02/23/22 1939)  ? ? ? ?IMPRESSION / MDM / ASSESSMENT AND PLAN / ED COURSE  ?I reviewed the triage vital signs and the nursing notes. ?             ?               ? ?Differential diagnosis includes, but is not limited to paroxysmal tachyarrhythmias, metabolic  derangements, anemia, hypothyroidism, medication side effects, and PE with a lower suspicion based on his history and exam for CVA, acute traumatic process or acute infectious process.  ? ?EKG is remarkable for sinus rhythm with a ventricular rate of 98, right bundle branch block currently deep T wave inversion and ST depression in lead III and aVF as well as V6 without other clear evidence  of acute ischemia.  Troponin elevated but stable at 2123 which is suspect represents a mild demand ischemia with a lower suspicion for occlusion MI at this time. ? ? ?Chest x-ray my interpretation shows some mild cardiomegaly without overt edema, infarct, effusion, focal consolidation or other clear acute process.  I also viewed radiology interpretation. ? ?CTA chest my interpretation without evidence of a large PE, pneumonia, effusion does show cardiomegaly without clear edema.  There is also evidence of aortic atherosclerosis.  I reviewed radiology interpretation and agree with the findings as well. ? ?BMP is remarkable for evidence of slight decrease in kidney function with a creatinine of 1.3 compared to 1.053 months ago without any other new electrolyte or metabolic derangements.  CBC without leukocytosis or acute anemia.  TSH is within normal limits.  Magnesium is unremarkable.  Hepatic function panel is grossly unremarkable aside from some indirect bilirubinemia slightly above upper limits of normal.  UA has some hemoglobin and ketones but otherwise no evidence of infection.  BMP did result expectedly high at 903.  On further questioning patient does endorse some orthopnea given cardiomegaly and concern for undiagnosed CHF.  Certainly possible this is the primary etiology of patient's symptoms with regards to his palpitations and shortness of breath.  I will admit to medicine service for further evaluation and management. ?  ? ? ?FINAL CLINICAL IMPRESSION(S) / ED DIAGNOSES  ? ?Final diagnoses:  ?Acute congestive heart  failure, unspecified heart failure type ([place])  ?Troponin I above reference range  ? ? ? ?Rx / DC Orders  ? ?ED Discharge Orders   ? ? None  ? ?  ? ? ? ?Note:  This document was prepared using Set designer

## 2022-02-23 NOTE — Assessment & Plan Note (Signed)
Continue trazodone 

## 2022-02-23 NOTE — ED Triage Notes (Signed)
Pt via POV from home. Pt c/o shakiness, weakness, unable to sit still for the past 2 days. Last time he was here they told him these symptoms were an anxiety attack, pt went to the walk in clinic 4/23. Intermittent confusion per the pt's wife.  Denies pain. Pt is A&Ox4 and NAD.  ?

## 2022-02-23 NOTE — Assessment & Plan Note (Addendum)
No complaints of chest pain and EKG nonacute ?Possibly demand ischemia from tachycardia, versus possibility of CHF given elevated BNP and cardiomegaly ?We will continue to trend ?We will give low-dose diuretics ?Daily weights with intake and output monitoring ?

## 2022-02-23 NOTE — Assessment & Plan Note (Signed)
Possible hepatic congestion related to possible CHF ?Follow-up echocardiogram. ?

## 2022-02-24 ENCOUNTER — Observation Stay
Admit: 2022-02-24 | Discharge: 2022-02-24 | Disposition: A | Discharge status: Home / Self Care | Payer: Medicare Other | Attending: Internal Medicine | Admitting: Internal Medicine

## 2022-02-24 ENCOUNTER — Other Ambulatory Visit (HOSPITAL_COMMUNITY): Payer: Self-pay

## 2022-02-24 ENCOUNTER — Encounter: Payer: Self-pay | Admitting: Internal Medicine

## 2022-02-24 DIAGNOSIS — Z8546 Personal history of malignant neoplasm of prostate: Secondary | ICD-10-CM | POA: Diagnosis not present

## 2022-02-24 DIAGNOSIS — Z9079 Acquired absence of other genital organ(s): Secondary | ICD-10-CM | POA: Diagnosis not present

## 2022-02-24 DIAGNOSIS — T502X5A Adverse effect of carbonic-anhydrase inhibitors, benzothiadiazides and other diuretics, initial encounter: Secondary | ICD-10-CM | POA: Diagnosis not present

## 2022-02-24 DIAGNOSIS — N179 Acute kidney failure, unspecified: Secondary | ICD-10-CM | POA: Diagnosis not present

## 2022-02-24 DIAGNOSIS — F419 Anxiety disorder, unspecified: Secondary | ICD-10-CM | POA: Diagnosis present

## 2022-02-24 DIAGNOSIS — G4733 Obstructive sleep apnea (adult) (pediatric): Secondary | ICD-10-CM | POA: Diagnosis present

## 2022-02-24 DIAGNOSIS — K761 Chronic passive congestion of liver: Secondary | ICD-10-CM | POA: Diagnosis present

## 2022-02-24 DIAGNOSIS — I248 Other forms of acute ischemic heart disease: Secondary | ICD-10-CM | POA: Diagnosis not present

## 2022-02-24 DIAGNOSIS — R002 Palpitations: Secondary | ICD-10-CM | POA: Diagnosis present

## 2022-02-24 DIAGNOSIS — I5041 Acute combined systolic (congestive) and diastolic (congestive) heart failure: Secondary | ICD-10-CM | POA: Diagnosis present

## 2022-02-24 DIAGNOSIS — I959 Hypotension, unspecified: Secondary | ICD-10-CM | POA: Diagnosis not present

## 2022-02-24 DIAGNOSIS — I081 Rheumatic disorders of both mitral and tricuspid valves: Secondary | ICD-10-CM | POA: Diagnosis present

## 2022-02-24 DIAGNOSIS — I4510 Unspecified right bundle-branch block: Secondary | ICD-10-CM | POA: Diagnosis present

## 2022-02-24 DIAGNOSIS — J449 Chronic obstructive pulmonary disease, unspecified: Secondary | ICD-10-CM | POA: Diagnosis present

## 2022-02-24 DIAGNOSIS — G9341 Metabolic encephalopathy: Secondary | ICD-10-CM | POA: Diagnosis not present

## 2022-02-24 DIAGNOSIS — Z87891 Personal history of nicotine dependence: Secondary | ICD-10-CM | POA: Diagnosis not present

## 2022-02-24 DIAGNOSIS — G4700 Insomnia, unspecified: Secondary | ICD-10-CM | POA: Diagnosis present

## 2022-02-24 DIAGNOSIS — Z79899 Other long term (current) drug therapy: Secondary | ICD-10-CM | POA: Diagnosis not present

## 2022-02-24 DIAGNOSIS — F05 Delirium due to known physiological condition: Secondary | ICD-10-CM | POA: Diagnosis present

## 2022-02-24 DIAGNOSIS — G20 Parkinson's disease: Secondary | ICD-10-CM | POA: Diagnosis present

## 2022-02-24 DIAGNOSIS — I700 Atherosclerosis of aorta: Secondary | ICD-10-CM | POA: Diagnosis present

## 2022-02-24 LAB — ECHOCARDIOGRAM COMPLETE
AR max vel: 2.75 cm2
AV Area VTI: 3.05 cm2
AV Area mean vel: 2.44 cm2
AV Mean grad: 2 mmHg
AV Peak grad: 3.2 mmHg
Ao pk vel: 0.89 m/s
Area-P 1/2: 8.52 cm2
Calc EF: 32.5 %
MV VTI: 2.7 cm2
S' Lateral: 5.55 cm
Single Plane A2C EF: 46.3 %
Single Plane A4C EF: 18.5 %

## 2022-02-24 LAB — COMPREHENSIVE METABOLIC PANEL
ALT: 7 U/L (ref 0–44)
AST: 31 U/L (ref 15–41)
Albumin: 4.5 g/dL (ref 3.5–5.0)
Alkaline Phosphatase: 54 U/L (ref 38–126)
Anion gap: 8 (ref 5–15)
BUN: 25 mg/dL — ABNORMAL HIGH (ref 8–23)
CO2: 29 mmol/L (ref 22–32)
Calcium: 9.2 mg/dL (ref 8.9–10.3)
Chloride: 101 mmol/L (ref 98–111)
Creatinine, Ser: 1.33 mg/dL — ABNORMAL HIGH (ref 0.61–1.24)
GFR, Estimated: 55 mL/min — ABNORMAL LOW (ref >60)
Glucose, Bld: 99 mg/dL (ref 70–99)
Potassium: 4 mmol/L (ref 3.5–5.1)
Sodium: 138 mmol/L (ref 135–145)
Total Bilirubin: 2.5 mg/dL — ABNORMAL HIGH (ref 0.3–1.2)
Total Protein: 7.3 g/dL (ref 6.5–8.1)

## 2022-02-24 LAB — CBC
HCT: 44 % (ref 39.0–52.0)
Hemoglobin: 14.7 g/dL (ref 13.0–17.0)
MCH: 31.5 pg (ref 26.0–34.0)
MCHC: 33.4 g/dL (ref 30.0–36.0)
MCV: 94.4 fL (ref 80.0–100.0)
Platelets: 179 10*3/uL (ref 150–400)
RBC: 4.66 MIL/uL (ref 4.22–5.81)
RDW: 13.3 % (ref 11.5–15.5)
WBC: 7.4 10*3/uL (ref 4.0–10.5)
nRBC: 0 % (ref 0.0–0.2)

## 2022-02-24 MED ORDER — SACUBITRIL-VALSARTAN 24-26 MG PO TABS
1.0000 | ORAL_TABLET | Freq: Two times a day (BID) | ORAL | Status: DC
  Administered 2022-02-24 (×2): 1 via ORAL
  Filled 2022-02-24 (×3): qty 1

## 2022-02-24 MED ORDER — LORAZEPAM 2 MG/ML IJ SOLN
1.0000 mg | Freq: Once | INTRAMUSCULAR | Status: AC
  Administered 2022-02-24: 1 mg via INTRAVENOUS
  Filled 2022-02-24: qty 1

## 2022-02-24 MED ORDER — TRAZODONE HCL 50 MG PO TABS: 50.0000 mg | ORAL_TABLET | Freq: Once | ORAL | Status: DC

## 2022-02-24 NOTE — Progress Notes (Signed)
Admission profile updated. ?

## 2022-02-24 NOTE — ED Notes (Signed)
Sitter at bedside due to pt continuing to get up ?

## 2022-02-24 NOTE — TOC Benefit Eligibility Note (Signed)
Patient Advocate Encounter ? ?Insurance verification completed.   ? ?The patient is currently admitted and upon discharge could be taking Entresto 24-26 mg. ? ?The current 30 day co-pay is, $121.45.  ? ?The patient is insured through [employer] Part D  ? ? ? ?Lyndel Safe, CPhT ?Pharmacy Patient Advocate Specialist ?[facility] Patient Advocate Team ?Direct Number: [phone]  Fax: [phone] ? ? ? ? ? ?  ?

## 2022-02-24 NOTE — Consult Note (Signed)
? ?  Heart Failure Nurse Navigator Note ? 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  Left ventricle is severely dilated.  Grade 3 diastolic dysfunction.  Severe biatrial enlargement.  Moderate tricuspid regurgitation.  Moderate mitral regurgitation. ? ?He presented to the emergency room with complaints of weakness, shaking feeling and shortness of breath. ? ?Comorbidities: ? ?COPD ?Insomnia ?Prostate cancer ?Parkinson's ?Asthma ?Left bundle branch bloc ? ? ?Medications: ? ?Furosemide 20 mg IV 2 times a day ?Entresto 24/26 mg 1 tablet 2 times a day ? ?Labs: ? ?BNP 903, sodium 138, potassium 4, chloride 101, CO2 29, BUN 25, creatinine 1.33, total bilirubin 1.9, direct bilirubin 0.3, indirect bilirubin 1.6. ?Weight is 64 kg ?Blood pressure 135/74. ? ? ?Initial meeting with patient, wife Nyoka Cowden was at the bedside. ? ?They state that they have heard the term heart failure in the past as his wife's been treated for heart failure. ? ?Discussed the function of his heart. ? ?Discussed  fluid restriction, low-sodium diet and removing the saltshaker from the table. ? ?And over the importance of daily weights, recording and what to report. ? ?He was asking appropriate questions about how this can be treated with medications. ? ?He states prior to retirement he had graduated from [employer] and worked as a Field seismologist. ? ?Made aware I will follow-up in the outpatient heart failure clinic which is May 11 at 9 AM. ? ?They had no further questions. ? ?Given the living with heart failure teaching booklet, zone magnet, information on sodium and weight chart. ? ?Pricilla Riffle RN CHFN ?

## 2022-02-24 NOTE — ED Notes (Signed)
Pt assisted with urinal. This tech assisted pt back into bed, pt covered with blankets and lights dimmed. No other needs voiced at this time. ?
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
## 2022-02-24 NOTE — Hospital Course (Addendum)
76 y.o. m w/ COPD, anxiety and insomnia, history of prostate cancer s/p prostatectomy, Parkinsonism,  followed by neurologist Dr. Melrose Nakayama, last seen in February 2023  presented to the ED with a 2-day history of weakness, feeling shaky and unable to sit still , feeling like he is having an anxiety attack. He also has episodes where he feels like his heart is racing and he has difficulty catching his breath.  He also reports ongoing difficulty sleeping. ?ED course: BP 155/95, pulse 108 with otherwise normal vitals.  Blood work notable for troponin 23, BNP 903, total bilirubin 1.9, indirect 1.6 and direct 0.3.  TSH 1.7.  Potassium and magnesium within normal limits. Urinalysis benign.  EKG- showed NSR at 98 with a right bundle branch block and no acute ST-T wave changes.  Cxr-mild cardiomegaly without overt edema.CTA chest shows cardiomegaly without edema and no evidence of PE. Was given a dose of Lasix after initially given a fluid bolus and admitted for possible new onset CHF evaluation/palpitation.  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

## 2022-02-24 NOTE — Progress Notes (Signed)
*  PRELIMINARY RESULTS* ?Echocardiogram ?2D Echocardiogram has been performed. ? ?Carl Mcclain, [name] ?02/24/2022, 10:24 AM ?

## 2022-02-24 NOTE — Consult Note (Signed)
? ? ?Carl Mcclain is a 76 y.o. male  [id number] ? ?Primary Cardiologist: Neoma Laming, MD ?Reason for Consultation: CHF ? ?HPI: 76 y.o. male w/ COPD, anxiety and insomnia, history of prostate cancer s/p prostatectomy, Parkinsonism,  followed by neurologist Dr. Melrose Nakayama, last seen in February 2023. Presented to the ED with a 2-day history of weakness, feeling shaky and unable to sit still , feeling like he is having an anxiety attack. He also has episodes where he feels like his heart is racing and he has difficulty catching his breath.  He also reports ongoing difficulty sleeping. Reports feeling like he is suffocating when he lays down.  ? ? ?Review of Systems: denies chest pain. Reports shortness of breath ,weakness, fatigue.  ? ? ?Past Medical History:  ?Diagnosis Date  ? Asthma   ? Cancer [facility])   ? COPD (chronic obstructive pulmonary disease) ([place])   ? Dyspnea   ? OSA (obstructive sleep apnea)   ? Parkinson disease ([place])   ? Pneumonia   ? ? ?(Not in a hospital admission) ? ? ? ? carbidopa-levodopa  1 tablet Oral q1800  ? carbidopa-levodopa  2 tablet Oral TID  ? Carbidopa-Levodopa ER  1 tablet Oral QID  ? enoxaparin (LOVENOX) injection  40 mg Subcutaneous Q24H  ? furosemide  20 mg Intravenous BID  ? gabapentin  100 mg Oral BID  ? traZODone  25 mg Oral QHS  ? ? ?Infusions: ? ? ?No Known Allergies ? ?Social History  ? ?Socioeconomic History  ? Marital status: Married  ?  Spouse name: Jguadalupe Opiela  ? Number of children: 4  ? Years of education: Not on file  ? Highest education level: Not on file  ?Occupational History  ? Not on file  ?Tobacco Use  ? Smoking status: Former  ?  Types: Cigarettes  ? Smokeless tobacco: Never  ?Substance and Sexual Activity  ? Alcohol use: Never  ? Drug use: Never  ? Sexual activity: Yes  ?Other Topics Concern  ? Not on file  ?Social History Narrative  ? Not on file  ? ?Social Determinants of Health  ? ?Financial Resource Strain: Not on file  ?Food Insecurity: Not on file  ?Transportation  Needs: Not on file  ?Physical Activity: Not on file  ?Stress: Not on file  ?Social Connections: Not on file  ?Intimate Partner Violence: Not on file  ? ? ?No family history on file. ? ?PHYSICAL EXAM: ?Vitals:  ? 02/24/22 0900 02/24/22 1200  ?BP: (!) 142/63 114/81  ?Pulse: 96 93  ?Resp: (!) 21 (!) 29  ?Temp:    ?SpO2: 99% 97%  ? ? ? ?Intake/Output Summary (Last 24 hours) at 02/24/2022 1244 ?Last data filed at 02/24/2022 0730 ?Gross per 24 hour  ?Intake 500 ml  ?Output 300 ml  ?Net 200 ml  ? ? ?General:  Well appearing. No respiratory difficulty ?HEENT: normal ?Neck: supple. no JVD. Carotids 2+ bilat; no bruits. No lymphadenopathy or thryomegaly appreciated. ?Cor: PMI nondisplaced. Regular rate & rhythm. No rubs, gallops or murmurs. ?Lungs: clear ?Abdomen: soft, nontender, nondistended. No hepatosplenomegaly. No bruits or masses. Good bowel sounds. ?Extremities: no cyanosis, clubbing, rash, edema ?Neuro: alert & oriented x 3, cranial nerves grossly intact. moves all 4 extremities w/o difficulty. Affect pleasant. ? ?ECG: NSR, HR 86 bpm, LBBB ? ?Results for orders placed or performed during the hospital encounter of 02/23/22 (from the past 24 hour(s))  ?Basic metabolic panel     Status: Abnormal  ? Collection Time: 02/23/22  4:50 PM  ?Result Value Ref Range  ? Sodium 133 (L) 135 - 145 mmol/L  ? Potassium 4.2 3.5 - 5.1 mmol/L  ? Chloride 99 98 - 111 mmol/L  ? CO2 26 22 - 32 mmol/L  ? Glucose, Bld 101 (H) 70 - 99 mg/dL  ? BUN 26 (H) 8 - 23 mg/dL  ? Creatinine, Ser 1.30 (H) 0.61 - 1.24 mg/dL  ? Calcium 9.1 8.9 - 10.3 mg/dL  ? GFR, Estimated 57 (L) >60 mL/min  ? Anion gap 8 5 - 15  ?CBC     Status: None  ? Collection Time: 02/23/22  4:50 PM  ?Result Value Ref Range  ? WBC 6.8 4.0 - 10.5 K/uL  ? RBC 4.60 4.22 - 5.81 MIL/uL  ? Hemoglobin 14.3 13.0 - 17.0 g/dL  ? HCT 43.8 39.0 - 52.0 %  ? MCV 95.2 80.0 - 100.0 fL  ? MCH 31.1 26.0 - 34.0 pg  ? MCHC 32.6 30.0 - 36.0 g/dL  ? RDW 13.3 11.5 - 15.5 %  ? Platelets 171 150 - 400 K/uL  ?  nRBC 0.0 0.0 - 0.2 %  ?Troponin I (High Sensitivity)     Status: Abnormal  ? Collection Time: 02/23/22  4:50 PM  ?Result Value Ref Range  ? Troponin I (High Sensitivity) 21 (H) <18 ng/L  ?Brain natriuretic peptide     Status: Abnormal  ? Collection Time: 02/23/22  4:57 PM  ?Result Value Ref Range  ? B Natriuretic Peptide 903.5 (H) 0.0 - 100.0 pg/mL  ?TSH     Status: None  ? Collection Time: 02/23/22  4:58 PM  ?Result Value Ref Range  ? TSH 1.700 0.350 - 4.500 uIU/mL  ?Magnesium     Status: None  ? Collection Time: 02/23/22  4:58 PM  ?Result Value Ref Range  ? Magnesium 2.2 1.7 - 2.4 mg/dL  ?Hepatic function panel     Status: Abnormal  ? Collection Time: 02/23/22  4:58 PM  ?Result Value Ref Range  ? Total Protein 7.2 6.5 - 8.1 g/dL  ? Albumin 4.3 3.5 - 5.0 g/dL  ? AST 33 15 - 41 U/L  ? ALT 6 0 - 44 U/L  ? Alkaline Phosphatase 54 38 - 126 U/L  ? Total Bilirubin 1.9 (H) 0.3 - 1.2 mg/dL  ? Bilirubin, Direct 0.3 (H) 0.0 - 0.2 mg/dL  ? Indirect Bilirubin 1.6 (H) 0.3 - 0.9 mg/dL  ?Urinalysis, Routine w reflex microscopic     Status: Abnormal  ? Collection Time: 02/23/22  6:39 PM  ?Result Value Ref Range  ? Color, Urine YELLOW (A) YELLOW  ? APPearance CLEAR (A) CLEAR  ? Specific Gravity, Urine 1.020 1.005 - 1.030  ? pH 6.0 5.0 - 8.0  ? Glucose, UA NEGATIVE NEGATIVE mg/dL  ? Hgb urine dipstick SMALL (A) NEGATIVE  ? Bilirubin Urine NEGATIVE NEGATIVE  ? Ketones, ur 5 (A) NEGATIVE mg/dL  ? Protein, ur NEGATIVE NEGATIVE mg/dL  ? Nitrite NEGATIVE NEGATIVE  ? Leukocytes,Ua NEGATIVE NEGATIVE  ? RBC / HPF 6-10 0 - 5 RBC/hpf  ? WBC, UA NONE SEEN 0 - 5 WBC/hpf  ? Bacteria, UA NONE SEEN NONE SEEN  ? Squamous Epithelial / LPF NONE SEEN 0 - 5  ? Mucus PRESENT   ?Troponin I (High Sensitivity)     Status: Abnormal  ? Collection Time: 02/23/22  6:49 PM  ?Result Value Ref Range  ? Troponin I (High Sensitivity) 23 (H) <18 ng/L  ?Comprehensive metabolic panel  Status: Abnormal  ? Collection Time: 02/24/22  4:11 AM  ?Result Value Ref Range   ? Sodium 138 135 - 145 mmol/L  ? Potassium 4.0 3.5 - 5.1 mmol/L  ? Chloride 101 98 - 111 mmol/L  ? CO2 29 22 - 32 mmol/L  ? Glucose, Bld 99 70 - 99 mg/dL  ? BUN 25 (H) 8 - 23 mg/dL  ? Creatinine, Ser 1.33 (H) 0.61 - 1.24 mg/dL  ? Calcium 9.2 8.9 - 10.3 mg/dL  ? Total Protein 7.3 6.5 - 8.1 g/dL  ? Albumin 4.5 3.5 - 5.0 g/dL  ? AST 31 15 - 41 U/L  ? ALT 7 0 - 44 U/L  ? Alkaline Phosphatase 54 38 - 126 U/L  ? Total Bilirubin 2.5 (H) 0.3 - 1.2 mg/dL  ? GFR, Estimated 55 (L) >60 mL/min  ? Anion gap 8 5 - 15  ?CBC     Status: None  ? Collection Time: 02/24/22  4:11 AM  ?Result Value Ref Range  ? WBC 7.4 4.0 - 10.5 K/uL  ? RBC 4.66 4.22 - 5.81 MIL/uL  ? Hemoglobin 14.7 13.0 - 17.0 g/dL  ? HCT 44.0 39.0 - 52.0 %  ? MCV 94.4 80.0 - 100.0 fL  ? MCH 31.5 26.0 - 34.0 pg  ? MCHC 33.4 30.0 - 36.0 g/dL  ? RDW 13.3 11.5 - 15.5 %  ? Platelets 179 150 - 400 K/uL  ? nRBC 0.0 0.0 - 0.2 %  ? ?DG Chest 2 View ? ?Result Date: 02/23/2022 ?CLINICAL DATA:  Shortness of breath.  Weakness. EXAM: CHEST - 2 VIEW COMPARISON:  None available. FINDINGS: Upper normal heart size.The cardiomediastinal contours are normal. The lungs are clear. Pulmonary vasculature is normal. No consolidation, pleural effusion, or pneumothorax. No acute osseous abnormalities are seen. IMPRESSION: Borderline cardiomegaly. No acute chest findings. Electronically Signed   By: Keith Rake M.D.   On: 02/23/2022 19:16  ? ?CT Angio Chest PE W and/or Wo Contrast ? ?Result Date: 02/23/2022 ?CLINICAL DATA:  Weakness EXAM: CT ANGIOGRAPHY CHEST WITH CONTRAST TECHNIQUE: Multidetector CT imaging of the chest was performed using the standard protocol during bolus administration of intravenous contrast. Multiplanar CT image reconstructions and MIPs were obtained to evaluate the vascular anatomy. RADIATION DOSE REDUCTION: This exam was performed according to the departmental dose-optimization program which includes automated exposure control, adjustment of the mA and/or kV  according to patient size and/or use of iterative reconstruction technique. CONTRAST:  76m OMNIPAQUE IOHEXOL 350 MG/ML SOLN COMPARISON:  Chest x-ray 02/23/2022 FINDINGS: Cardiovascular: Satisfactory opacific

## 2022-02-24 NOTE — Progress Notes (Signed)
?PROGRESS NOTE ?Carl Mcclain  QMG:[id number] DOB: Jun 09, 1946 DOA: 02/23/2022 ?PCP: Leonel Ramsay, MD  ? ?Brief Narrative/Hospital Course: ?76 y.o. m w/ COPD, anxiety and insomnia, history of prostate cancer s/p prostatectomy, Parkinsonism,  followed by neurologist Dr. Melrose Nakayama, last seen in February 2023  presented to the ED with a 2-day history of weakness, feeling shaky and unable to sit still , feeling like he is having an anxiety attack. He also has episodes where he feels like his heart is racing and he has difficulty catching his breath.  He also reports ongoing difficulty sleeping. ?ED course: BP 155/95, pulse 108 with otherwise normal vitals.  Blood work notable for troponin 23, BNP 903, total bilirubin 1.9, indirect 1.6 and direct 0.3.  TSH 1.7.  Potassium and magnesium within normal limits. Urinalysis benign.  EKG- showed NSR at 98 with a right bundle branch block and no acute ST-T wave changes.  Cxr-mild cardiomegaly without overt edema.CTA chest shows cardiomegaly without edema and no evidence of PE. Was given a dose of Lasix after initially given a fluid bolus and admitted for possible new onset CHF evaluation/palpitation.   ?  ?Subjective: ?Seen and examined this morning not anxious, heart rate in low 100 denies chest pain.  Feels better,has peed with Lasix well ?  ?Assessment and Plan: ?Principal Problem: ?  Acute combined systolic and diastolic congestive heart failure (Carl Mcclain) ?Active Problems: ?  Palpitations ?  Elevated troponin ?  Hyperbilirubinemia ?  Parkinsonism ([name]) ?  Anxiety ?  Elevated serum creatinine ?  COPD (chronic obstructive pulmonary disease) ([name]) ?  Insomnia ?  History of prostate cancer ?  ?Assessment and Plan: ? ?Acute combined systolic and diastolic heart failure ?Severely reduced right ventricular systolic function ?Severely dilated LA and RA ?Palpitations ?Sinus tachycardia: ?Patient clinically improved with Lasix overnight but echo shows new onset acute combined systolic  diastolic dysfunction see below, EF 20-25%, G3 DD, global hypokinesia.  Unclear etiology, currently no chest pain.  Patient having palpitation and anxiety symptoms. work-up unremarkable for acute PE, TSH normal.  Dr. Humphrey Rolls from cardiology consulted monitor intake output telemetry.  Monitoring telemetry.  Defer diuretic regimen to cardiology. ? ?Elevated troponin-flat 21> 23 no delta, suspect demand ischemia in the setting of patient's acute heart failure .await cardiology input ? ?Hyperbilirubinemia: Likely from hepatic congestion from CHF.  Monitor, LFTs stable ? ?Parkinsonism: Followed by Dr. [name] patient's current meds ? ?Anxiety disorder ?Insomnia: ?on trazodone-could consider adding additional agent in the setting of #1 ? ?Elevated serum creatinine at 1.3 baseline 1.0, stable.  got fluid in the area after which got Lasix.Monitor. ? ?COPD: Stable not in exacerbation ? ?History of prostate cancer-FU OP ? ? ?TTE: ?Left ventricular ejection fraction, by estimation, is 20 to 25%. The  ?left ventricle has severely decreased function. The left ventricle  ?demonstrates global hypokinesis. The left ventricular internal cavity size  ?was severely dilated. Left ventricular  ?diastolic parameters are consistent with Grade III diastolic dysfunction  ?(restrictive).  ? 2. Right ventricular systolic function is severely reduced. The right  ?ventricular size is severely enlarged. Mildly increased right ventricular  ?wall thickness.  ? 3. Left atrial size was severely dilated.  ? 4. Right atrial size was severely dilated.  ? 5. The mitral valve is grossly normal. Moderate mitral valve  ?regurgitation.  ? 6. Tricuspid valve regurgitation is moderate.  ? 7. The aortic valve is grossly normal. Aortic valve regurgitation is  ?trivial. Aortic valve sclerosis is present, with no evidence of aortic  ?  valve stenosis.  ? ?DVT prophylaxis: enoxaparin (LOVENOX) injection 40 mg Start: 03/21/2022 2200 ?Code Status:   Code Status:  Full Code ?Family Communication: plan of care discussed with patient/WIFE at bedside. ?Patient status is: Observation but will need inpatient stay due to new onset heart failure level of care: Telemetry Cardiac  ?Patient currently NOT stable ? ?Dispo: The patient is from: HOME ?           Anticipated disposition: HOME ? ?Mobility Assessment (last 72 hours)   ? ? Mobility Assessment   ? ? [place] Name 02/24/22 0746  ?  ?  ?  ?  ? Does patient have an order for bedrest or is patient medically unstable No - Continue assessment      ? What is the highest level of mobility based on the progressive mobility assessment? Level 5 (Walks with assist in room/hall) - Balance while stepping forward/back and can walk in room with assist - Complete      ? ?  ?  ? ?  ?  ? ?Objective: ?Vitals last 24 hrs: ?Vitals:  ? 02/24/22 0430 02/24/22 0500 02/24/22 0700 02/24/22 0900  ?BP: 134/87 (!) 115/91 116/64 (!) 142/63  ?Pulse: (!) 101 98 73 96  ?Resp: '18 20 12 '$ (!) 21  ?Temp:      ?TempSrc:      ?SpO2: 93% 95% 96% 99%  ? ?Weight change:  ? ?Physical Examination: ?General exam: AAox3,older than stated age, weak appearing. ?HEENT:Oral mucosa moist, Ear/Nose WNL grossly, dentition normal. ?Respiratory system: bilaterally diminished BS, no use of accessory muscle ?Cardiovascular system: S1 & S2 +, No JVD,. ?Gastrointestinal system: Abdomen soft,NT,ND, BS+ ?Nervous System:Alert, awake, moving extremities and grossly nonfocal ?Extremities: LE edema mild,distal peripheral pulses palpable.  ?Skin: No rashes,no icterus. ?MSK: Normal muscle bulk,tone, power ? ?Medications reviewed:  ?Scheduled Meds: ? carbidopa-levodopa  1 tablet Oral q1800  ? carbidopa-levodopa  2 tablet Oral TID  ? Carbidopa-Levodopa ER  1 tablet Oral QID  ? enoxaparin (LOVENOX) injection  40 mg Subcutaneous Q24H  ? furosemide  20 mg Intravenous BID  ? gabapentin  100 mg Oral BID  ? traZODone  25 mg Oral QHS  ? ?Continuous Infusions: ? ?  ?Diet Order   ? ?       ?  Diet Heart Room  service appropriate? Yes; Fluid consistency: Thin  Diet effective now       ?  ? ?  ?  ? ?  ?  ? ?  ?  ?  ? ? ?Intake/Output Summary (Last 24 hours) at 02/24/2022 1110 ?Last data filed at 02/24/2022 0730 ?Gross per 24 hour  ?Intake 500 ml  ?Output 300 ml  ?Net 200 ml  ? ?Net IO Since Admission: 200 mL [02/24/22 1110]  ?Wt Readings from Last 3 Encounters:  ?11/18/21 68 kg  ?  ? ?Unresulted Labs (From admission, onward)  ? ?  Start     Ordered  ? 03/02/22 0500  Creatinine, serum  (enoxaparin (LOVENOX)    CrCl >/= 30 ml/min)  Weekly,   R     ?Comments: while on enoxaparin therapy ?  ? Mar 21, 2022 2054  ? ?  ?  ? ?  ?Data Reviewed: I have personally reviewed following labs and imaging studies ?CBC: ?Recent Labs  ?Lab 03/21/22 ?1650 02/24/22 ?0411  ?WBC 6.8 7.4  ?HGB 14.3 14.7  ?HCT 43.8 44.0  ?MCV 95.2 94.4  ?PLT 171 179  ? ?Basic Metabolic Panel: ?Recent Labs  ?Lab Mar 21, 2022 ?  1650 02/23/22 ?1658 02/24/22 ?0411  ?NA 133*  --  138  ?K 4.2  --  4.0  ?CL 99  --  101  ?CO2 26  --  29  ?GLUCOSE 101*  --  99  ?BUN 26*  --  25*  ?CREATININE 1.30*  --  1.33*  ?CALCIUM 9.1  --  9.2  ?MG  --  2.2  --   ? ?GFR: ?Estimated Creatinine Clearance: 44.7 mL/min (A) (by C-G formula based on SCr of 1.33 mg/dL (H)). ?Liver Function Tests: ?Recent Labs  ?Lab 02/23/22 ?1658 02/24/22 ?0411  ?AST 33 31  ?ALT 6 7  ?ALKPHOS 54 54  ?BILITOT 1.9* 2.5*  ?PROT 7.2 7.3  ?ALBUMIN 4.3 4.5  ? ?No results for input(s): LIPASE, AMYLASE in the last 168 hours. ?No results for input(s): AMMONIA in the last 168 hours. ?Coagulation Profile: ?No results for input(s): INR, PROTIME in the last 168 hours. ?BNP (last 3 results) ?No results for input(s): PROBNP in the last 8760 hours. ?HbA1C: ?No results for input(s): HGBA1C in the last 72 hours. ?CBG: ?No results for input(s): GLUCAP in the last 168 hours. ?Lipid Profile: ?No results for input(s): CHOL, HDL, LDLCALC, TRIG, CHOLHDL, LDLDIRECT in the last 72 hours. ?Thyroid Function Tests: ?Recent Labs  ?  02/23/22 ?1658   ?TSH 1.700  ? ?Sepsis Labs: ?No results for input(s): PROCALCITON, LATICACIDVEN in the last 168 hours. ? ?No results found for this or any previous visit (from the past 240 hour(s)).  ?Antimicrobials: ?Anti

## 2022-02-25 DIAGNOSIS — I5041 Acute combined systolic (congestive) and diastolic (congestive) heart failure: Secondary | ICD-10-CM | POA: Diagnosis not present

## 2022-02-25 DIAGNOSIS — G9341 Metabolic encephalopathy: Secondary | ICD-10-CM

## 2022-02-25 MED ORDER — TIOTROPIUM BROMIDE MONOHYDRATE 18 MCG IN CAPS
1.0000 | ORAL_CAPSULE | Freq: Every day | RESPIRATORY_TRACT | Status: DC
  Administered 2022-02-25 – 2022-02-26 (×2): 18 ug via RESPIRATORY_TRACT
  Filled 2022-02-25: qty 5

## 2022-02-25 MED ORDER — LORAZEPAM 2 MG/ML IJ SOLN
1.0000 mg | Freq: Once | INTRAMUSCULAR | Status: AC
  Administered 2022-02-25: 1 mg via INTRAVENOUS
  Filled 2022-02-25: qty 1

## 2022-02-25 MED ORDER — ALPRAZOLAM 0.25 MG PO TABS
0.2500 mg | ORAL_TABLET | Freq: Three times a day (TID) | ORAL | Status: DC | PRN
  Administered 2022-02-25 (×2): 0.25 mg via ORAL
  Filled 2022-02-25 (×2): qty 1

## 2022-02-25 NOTE — Progress Notes (Signed)
?PROGRESS NOTE ?Carl Mcclain  WNU:[id number] DOB: 1946/01/20 DOA: 02/23/2022 ?PCP: Leonel Ramsay, MD  ? ?Brief Narrative/Hospital Course: ?76 y.o. m w/ COPD, anxiety and insomnia, history of prostate cancer s/p prostatectomy, Parkinsonism,  followed by neurologist Dr. Melrose Nakayama, last seen in February 2023  presented to the ED with a 2-day history of weakness, feeling shaky and unable to sit still , feeling like he is having an anxiety attack. He also has episodes where he feels like his heart is racing and he has difficulty catching his breath.  He also reports ongoing difficulty sleeping. ?ED course: BP 155/95, pulse 108 with otherwise normal vitals.  Blood work notable for troponin 23, BNP 903, total bilirubin 1.9, indirect 1.6 and direct 0.3.  TSH 1.7.  Potassium and magnesium within normal limits. Urinalysis benign.  EKG- showed NSR at 98 with a right bundle branch block and no acute ST-T wave changes.  Cxr-mild cardiomegaly without overt edema.CTA chest shows cardiomegaly without edema and no evidence of PE. Was given a dose of Lasix after initially given a fluid bolus and admitted for possible new onset CHF evaluation/palpitation.  ?Patient found to have acute combined systolic diastolic CHF cardiology consulted  ?  ?Subjective: ?Seen and examined this morning wife at the bedside. ?Overnight patient has been forgetful and subsequently became agitated needing one-to-one sitter.  He received Ativan 1 mg x 2-and currently sleeping. ?  ?Assessment and Plan: ?Principal Problem: ?  Acute combined systolic and diastolic congestive heart failure (HCC) ?Active Problems: ?  Palpitations ?  Elevated troponin ?  Hyperbilirubinemia ?  Parkinsonism ([place]) ?  Anxiety ?  Elevated serum creatinine ?  COPD (chronic obstructive pulmonary disease) ([place]) ?  Insomnia ?  History of prostate cancer ?  Acute combined systolic (congestive) and diastolic (congestive) heart failure (HCC) ?  Acute metabolic encephalopathy ?  ?Assessment  and Plan: ? ?Acute combined systolic and diastolic heart failure ?Severely reduced right ventricular systolic function ?Severely dilated LA and RA ?Palpitations ?Sinus tachycardia: ?Pressure cardiology input on board, continue on IV Lasix 20 g twice daily, initiated on Entresto, Echo- LVEF 20-25%, G3 DD, global hypokinesia.  Unclear etiology, currently no chest pain. CTA no acute PE, TSH normal.  Dr. Humphrey Rolls from cardiology following.  Continue to monitor intake output Daily weight and fluid status.   ?Net IO Since Admission: -140 mL [02/25/22 1129]  ? ?Intake/Output Summary (Last 24 hours) at 02/25/2022 1129 ?Last data filed at 02/25/2022 0415 ?Gross per 24 hour  ?Intake 360 ml  ?Output 700 ml  ?Net -340 ml  ?  ? ?Elevated troponin ?Demand ischemia: flat trops 21> 23 no delta, suspect demand ischemia in the setting of patient's acute heart failure .await cardiology input ? ?Hyperbilirubinemia: Likely from hepatic congestion from CHF.  Monitor, LFTs stable ? ?Parkinsonism: Followed by Dr. [name] patient's current meds ? ?Delirium ?Acute metabolic encephalopathy ?Forgetfulness/agitation: ?Patient forgetful confused agitated in night suspect given his parkinsonism, anxiety issues-sundowning.  Wife reports he recently has been forgetful, has not been getting adequate sleep at home.Continue supportive care fall precaution one-to-one. ? ? ?Anxiety disorder ?Insomnia: ?on trazodone-could consider adding additional agent in the setting of #1 ? ?Elevated serum creatinine at 1.3 baseline 1.0, stable.  Monitor closely while initiating cardiac regimen ?Recent Labs  ?Lab 02/23/22 ?1650 02/24/22 ?0411  ?BUN 26* 25*  ?CREATININE 1.30* 1.33*  ?  ?COPD: Stable not in exacerbation ? ?History of prostate cancer-FU OP ? ?TTE: ?Left ventricular ejection fraction, by estimation, is 20 to 25%.  The  ?left ventricle has severely decreased function. The left ventricle  ?demonstrates global hypokinesis. The left ventricular internal  cavity size  ?was severely dilated. Left ventricular  ?diastolic parameters are consistent with Grade III diastolic dysfunction  ?(restrictive).  ? 2. Right ventricular systolic function is severely reduced. The right  ?ventricular size is severely enlarged. Mildly increased right ventricular  ?wall thickness.  ? 3. Left atrial size was severely dilated.  ? 4. Right atrial size was severely dilated.  ? 5. The mitral valve is grossly normal. Moderate mitral valve  ?regurgitation.  ? 6. Tricuspid valve regurgitation is moderate.  ? 7. The aortic valve is grossly normal. Aortic valve regurgitation is  ?trivial. Aortic valve sclerosis is present, with no evidence of aortic  ?valve stenosis.  ? ?DVT prophylaxis: enoxaparin (LOVENOX) injection 40 mg Start: 02/23/22 2200 ?Code Status:   Code Status: Full Code ?Family Communication: plan of care discussed with patient/WIFE at bedside 4/27. ?Patient status is: Observation but will need inpatient stay due to new onset heart failure level of care: Telemetry Cardiac  ?Patient currently NOT stable ? ?Dispo: The patient is from: HOME ?           Anticipated disposition: HOME ? ?Mobility Assessment (last 72 hours)   ? ? Mobility Assessment   ? ? [place] Name 02/25/22 0715 02/24/22 2230 02/24/22 2102 02/24/22 0746  ?  ? Does patient have an order for bedrest or is patient medically unstable No - Continue assessment No - Continue assessment No - Continue assessment No - Continue assessment   ? What is the highest level of mobility based on the progressive mobility assessment? Level 5 (Walks with assist in room/hall) - Balance while stepping forward/back and can walk in room with assist - Complete Level 5 (Walks with assist in room/hall) - Balance while stepping forward/back and can walk in room with assist - Complete Level 6 (Walks independently in room and hall) - Balance while walking in room without assist - Complete Level 5 (Walks with assist in room/hall) - Balance while stepping  forward/back and can walk in room with assist - Complete   ? ?  ?  ? ?  ?  ? ?Objective: ?Vitals last 24 hrs: ?Vitals:  ? 02/24/22 2053 02/25/22 0021 02/25/22 0505 02/25/22 0800  ?BP:  98/69 123/79 95/60  ?Pulse: (!) 105 84  76  ?Resp: '16 12  14  '$ ?Temp: 97.7 ?F (36.5 ?C)  98.3 ?F (36.8 ?C) 98.4 ?F (36.9 ?C)  ?TempSrc: Oral  Oral   ?SpO2:  99% 99% 97%  ?Weight:   62.6 kg   ?Height:      ? ?Weight change:  ? ?Physical Examination: ?General exam: Sleeping, on room air  ?HEENT:Oral mucosa moist, Ear/Nose WNL grossly, dentition normal. ?Respiratory system: bilaterally clear, breathing spontaneously on room air, no use of accessory muscle ?Cardiovascular system: S1 & S2 +, No JVD,. ?Gastrointestinal system: Abdomen soft,NT,ND, BS+ ?Nervous System: sleepy/ sedated. ?Extremities: edema neg,distal peripheral pulses palpable.  ?Skin: No rashes,no icterus. ?MSK: Normal muscle bulk,tone, power ? ?Medications reviewed:  ?Scheduled Meds: ? carbidopa-levodopa  1 tablet Oral q1800  ? carbidopa-levodopa  2 tablet Oral TID  ? Carbidopa-Levodopa ER  1 tablet Oral QID  ? enoxaparin (LOVENOX) injection  40 mg Subcutaneous Q24H  ? furosemide  20 mg Intravenous BID  ? gabapentin  100 mg Oral BID  ? sacubitril-valsartan  1 tablet Oral BID  ? ?Continuous Infusions: ? ?  ?Diet Order   ? ?       ?  Diet Heart Room service appropriate? Yes; Fluid consistency: Thin  Diet effective now       ?  ? ?  ?  ? ?  ?  ? ?  ?  ?  ? ? ?Intake/Output Summary (Last 24 hours) at 02/25/2022 1129 ?Last data filed at 02/25/2022 0415 ?Gross per 24 hour  ?Intake 360 ml  ?Output 700 ml  ?Net -340 ml  ? ?Net IO Since Admission: -140 mL [02/25/22 1129]  ?Wt Readings from Last 3 Encounters:  ?02/25/22 62.6 kg  ?11/18/21 68 kg  ?  ? ?Unresulted Labs (From admission, onward)  ? ?  Start     Ordered  ? 03/02/22 0500  Creatinine, serum  (enoxaparin (LOVENOX)    CrCl >/= 30 ml/min)  Weekly,   R     ?Comments: while on enoxaparin therapy ?  ? 02/23/22 2054  ? 02/26/22 1031   Basic metabolic panel  Tomorrow morning,   R       ?Question:  Specimen collection method  Answer:  Lab=Lab collect  ? 02/25/22 0852  ? 02/26/22 0500  CBC  Tomorrow morning,   R       ?Question:  Specimen

## 2022-02-25 NOTE — Progress Notes (Signed)
SUBJECTIVE: 76 y.o. male w/ COPD, anxiety and insomnia, history of prostate cancer s/p prostatectomy, Parkinsonism,  followed by neurologist Dr. Melrose Nakayama, last seen in February 2023. Presented to the ED with a 2-day history of weakness, feeling shaky and unable to sit still , feeling like he is having an anxiety attack. He also has episodes where he feels like his heart is racing and he has difficulty catching his breath.  He also reports ongoing difficulty sleeping. Reports feeling like he is suffocating when he lays down.  ?  ? ? ?Vitals:  ? 02/24/22 2053 02/25/22 0021 02/25/22 0505 02/25/22 0800  ?BP:  98/69 123/79 95/60  ?Pulse: (!) 105 84  76  ?Resp: '16 12  14  '$ ?Temp: 97.7 ?F (36.5 ?C)  98.3 ?F (36.8 ?C) 98.4 ?F (36.9 ?C)  ?TempSrc: Oral  Oral   ?SpO2:  99% 99% 97%  ?Weight:   62.6 kg   ?Height:      ? ? ?Intake/Output Summary (Last 24 hours) at 02/25/2022 0903 ?Last data filed at 02/25/2022 0415 ?Gross per 24 hour  ?Intake 360 ml  ?Output 700 ml  ?Net -340 ml  ? ? ?LABS: ?Basic Metabolic Panel: ?Recent Labs  ?  02/23/22 ?1650 02/23/22 ?1658 02/24/22 ?0411  ?NA 133*  --  138  ?K 4.2  --  4.0  ?CL 99  --  101  ?CO2 26  --  29  ?GLUCOSE 101*  --  99  ?BUN 26*  --  25*  ?CREATININE 1.30*  --  1.33*  ?CALCIUM 9.1  --  9.2  ?MG  --  2.2  --   ? ?Liver Function Tests: ?Recent Labs  ?  02/23/22 ?1658 02/24/22 ?0411  ?AST 33 31  ?ALT 6 7  ?ALKPHOS 54 54  ?BILITOT 1.9* 2.5*  ?PROT 7.2 7.3  ?ALBUMIN 4.3 4.5  ? ?No results for input(s): LIPASE, AMYLASE in the last 72 hours. ?CBC: ?Recent Labs  ?  02/23/22 ?1650 02/24/22 ?0411  ?WBC 6.8 7.4  ?HGB 14.3 14.7  ?HCT 43.8 44.0  ?MCV 95.2 94.4  ?PLT 171 179  ? ?Cardiac Enzymes: ?No results for input(s): CKTOTAL, CKMB, CKMBINDEX, TROPONINI in the last 72 hours. ?BNP: ?Invalid input(s): POCBNP ?D-Dimer: ?No results for input(s): DDIMER in the last 72 hours. ?Hemoglobin A1C: ?No results for input(s): HGBA1C in the last 72 hours. ?Fasting Lipid Panel: ?No results for input(s): CHOL,  HDL, LDLCALC, TRIG, CHOLHDL, LDLDIRECT in the last 72 hours. ?Thyroid Function Tests: ?Recent Labs  ?  02/23/22 ?1658  ?TSH 1.700  ? ?Anemia Panel: ?No results for input(s): VITAMINB12, FOLATE, FERRITIN, TIBC, IRON, RETICCTPCT in the last 72 hours. ? ? ?PHYSICAL EXAM ?General: Well developed, well nourished, in no acute distress ?HEENT:  Normocephalic and atramatic ?Neck:  No JVD.  ?Lungs: Clear bilaterally to auscultation and percussion. ?Heart: HRRR . Normal S1 and S2 without gallops or murmurs.  ?Abdomen: Bowel sounds are positive, abdomen soft and non-tender  ?Msk:  Back normal, normal gait. Normal strength and tone for age. ?Extremities: No clubbing, cyanosis or edema.   ?Neuro: Alert and oriented X 3. ?Psych:  Good affect, responds appropriately ? ?TELEMETRY: NSR, HR 72 bpm ? ?ASSESSMENT AND PLAN: Patient difficult to arouse this morning. Overnight patient had been forgetful and subsequently became agitated, needing one-to-one sitter.  He received Ativan 1 mg x 2. Entresto started yesterday. Will consider adding spironolactone 12.5 mg and carvedilol as tolerated. Will continue to follow. ? ? ?Principal Problem: ?  Acute  combined systolic and diastolic congestive heart failure ([place]) ?Active Problems: ?  Palpitations ?  COPD (chronic obstructive pulmonary disease) ([place]) ?  Parkinsonism ([place]) ?  Insomnia ?  Anxiety ?  Hyperbilirubinemia ?  Elevated troponin ?  History of prostate cancer ?  Elevated serum creatinine ?  Acute combined systolic (congestive) and diastolic (congestive) heart failure (HCC) ?  ? ?[employer], FNP-C ?02/25/2022 ?9:03 AM ? ? ? ?    ?

## 2022-02-25 NOTE — Progress Notes (Signed)
Mobility Specialist - Progress Note ? ? ? 02/25/22 1400  ?Mobility  ?Activity Stood at bedside;Dangled on edge of bed;Ambulated with assistance in hallway  ?Level of Assistance Contact guard assist, steadying assist  ?Assistive Device None  ?Distance Ambulated (ft) 190 ft  ?Activity Response Tolerated well  ?$Mobility charge 1 Mobility  ? ? ? ?Pre-mobility: 101 HR, 98% SpO2 ?During mobility:111 HR, 97% SpO2 ? ?Pt supine upon arrival using RA with wife at bedside. Pt completes bed mobility to sit EOB ModI and STS with MinA. Ambulates 1 lap around nursing station voicing no complaints and returns to bed with needs in reach and family at bedside. ? ?Carl Mcclain ?Mobility Specialist ?02/25/22, 2:44 PM ? ? ? ? ?

## 2022-02-26 LAB — CBC
HCT: 48.7 % (ref 39.0–52.0)
Hemoglobin: 16.1 g/dL (ref 13.0–17.0)
MCH: 31.3 pg (ref 26.0–34.0)
MCHC: 33.1 g/dL (ref 30.0–36.0)
MCV: 94.6 fL (ref 80.0–100.0)
Platelets: 191 10*3/uL (ref 150–400)
RBC: 5.15 MIL/uL (ref 4.22–5.81)
RDW: 13.2 % (ref 11.5–15.5)
WBC: 7.3 10*3/uL (ref 4.0–10.5)
nRBC: 0 % (ref 0.0–0.2)

## 2022-02-26 LAB — BASIC METABOLIC PANEL
Anion gap: 9 (ref 5–15)
BUN: 51 mg/dL — ABNORMAL HIGH (ref 8–23)
CO2: 30 mmol/L (ref 22–32)
Calcium: 9.4 mg/dL (ref 8.9–10.3)
Chloride: 94 mmol/L — ABNORMAL LOW (ref 98–111)
Creatinine, Ser: 1.85 mg/dL — ABNORMAL HIGH (ref 0.61–1.24)
GFR, Estimated: 37 mL/min — ABNORMAL LOW (ref >60)
Glucose, Bld: 107 mg/dL — ABNORMAL HIGH (ref 70–99)
Potassium: 4.3 mmol/L (ref 3.5–5.1)
Sodium: 133 mmol/L — ABNORMAL LOW (ref 135–145)

## 2022-02-26 MED ORDER — ENALAPRIL MALEATE 2.5 MG PO TABS: 2.5000 mg | ORAL_TABLET | Freq: Every day | ORAL | 2 refills | Status: DC

## 2022-02-26 MED ORDER — ALPRAZOLAM 0.25 MG PO TABS: 0.2500 mg | ORAL_TABLET | Freq: Every evening | ORAL | 0 refills | Status: AC | PRN

## 2022-02-26 MED ORDER — FUROSEMIDE 20 MG PO TABS: 20.0000 mg | ORAL_TABLET | Freq: Every day | ORAL | 2 refills | Status: DC

## 2022-02-26 MED ORDER — ALPRAZOLAM 0.25 MG PO TABS: 0.2500 mg | ORAL_TABLET | Freq: Every evening | ORAL | Status: DC | PRN

## 2022-02-26 NOTE — TOC Transition Note (Signed)
Transition of Care (TOC) - CM/SW Discharge Note ? ? ?Patient Details  ?Name: Raegan Sipp ?MRN: [id number] ?Date of Birth: [birth year] ? ?Transition of Care (TOC) CM/SW Contact:  ?Izola Price, RN ?Phone Number: ?02/26/2022, 6:13 PM ? ? ?Clinical Narrative:  4/29: Patient discharged today to home/self care. CHF consult completed by HF RN on 02/24/22. No PT/OT evaluations ordered/done per consult order.  No other TOC needs identified at time of discharge. Simmie Davies RN CM  ? ? ? ?  ?  ? ? ?Patient Goals and CMS Choice ?  ?  ?  ? ?Discharge Placement ?  ?           ?  ?  ?  ?  ? ?Discharge Plan and Services ?  ?  ?           ?  ?  ?  ?  ?  ?  ?  ?  ?  ?  ? ?Social Determinants of Health (SDOH) Interventions ?  ? ? ?Readmission Risk Interventions ?   ? View : No data to display.  ?  ?  ?  ? ? ? ? ? ?

## 2022-02-26 NOTE — Progress Notes (Signed)
SUBJECTIVE: Feeling much better much more alert and oriented. ? ? ?Vitals:  ? 02/25/22 2334 02/26/22 0320 02/26/22 0423 02/26/22 0753  ?BP: (!) 99/56 (!) 96/57  126/72  ?Pulse: 94 83  75  ?Resp: '20 16  16  '$ ?Temp: 98.2 ?F (36.8 ?C) 98.2 ?F (36.8 ?C)  (!) 97.5 ?F (36.4 ?C)  ?TempSrc: Oral Oral  Oral  ?SpO2: 99% 98%  98%  ?Weight:   64.6 kg   ?Height:      ? ? ?Intake/Output Summary (Last 24 hours) at 02/26/2022 1128 ?Last data filed at 02/26/2022 0900 ?Gross per 24 hour  ?Intake 480 ml  ?Output --  ?Net 480 ml  ? ? ?LABS: ?Basic Metabolic Panel: ?Recent Labs  ?  02/23/22 ?1658 02/24/22 ?0411 02/26/22 ?0436  ?NA  --  138 133*  ?K  --  4.0 4.3  ?CL  --  101 94*  ?CO2  --  29 30  ?GLUCOSE  --  99 107*  ?BUN  --  25* 51*  ?CREATININE  --  1.33* 1.85*  ?CALCIUM  --  9.2 9.4  ?MG 2.2  --   --   ? ?Liver Function Tests: ?Recent Labs  ?  02/23/22 ?1658 02/24/22 ?0411  ?AST 33 31  ?ALT 6 7  ?ALKPHOS 54 54  ?BILITOT 1.9* 2.5*  ?PROT 7.2 7.3  ?ALBUMIN 4.3 4.5  ? ?No results for input(s): LIPASE, AMYLASE in the last 72 hours. ?CBC: ?Recent Labs  ?  02/24/22 ?0411 02/26/22 ?0436  ?WBC 7.4 7.3  ?HGB 14.7 16.1  ?HCT 44.0 48.7  ?MCV 94.4 94.6  ?PLT 179 191  ? ?Cardiac Enzymes: ?No results for input(s): CKTOTAL, CKMB, CKMBINDEX, TROPONINI in the last 72 hours. ?BNP: ?Invalid input(s): POCBNP ?D-Dimer: ?No results for input(s): DDIMER in the last 72 hours. ?Hemoglobin A1C: ?No results for input(s): HGBA1C in the last 72 hours. ?Fasting Lipid Panel: ?No results for input(s): CHOL, HDL, LDLCALC, TRIG, CHOLHDL, LDLDIRECT in the last 72 hours. ?Thyroid Function Tests: ?Recent Labs  ?  02/23/22 ?1658  ?TSH 1.700  ? ?Anemia Panel: ?No results for input(s): VITAMINB12, FOLATE, FERRITIN, TIBC, IRON, RETICCTPCT in the last 72 hours. ? ? ?PHYSICAL EXAM ?General: Well developed, well nourished, in no acute distress ?HEENT:  Normocephalic and atramatic ?Neck:  No JVD.  ?Lungs: Clear bilaterally to auscultation and percussion. ?Heart: HRRR . Normal  S1 and S2 without gallops or murmurs.  ?Abdomen: Bowel sounds are positive, abdomen soft and non-tender  ?Msk:  Back normal, normal gait. Normal strength and tone for age. ?Extremities: No clubbing, cyanosis or edema.   ?Neuro: Alert and oriented X 3. ?Psych:  Good affect, responds appropriately ? ?TELEMETRY: Sinus rhythm ? ?ASSESSMENT AND PLAN: HFrEF with decompensated heart failure.  At times blood pressure systolic drops to below 90.  If Delene Loll is his causing the hypotension may consider sending home on enalapril 5 mg once a day.  With the Lasix also 20 mg once a day. ? ?Principal Problem: ?  Acute combined systolic and diastolic congestive heart failure ([place]) ?Active Problems: ?  Palpitations ?  COPD (chronic obstructive pulmonary disease) ([place]) ?  Parkinsonism ([place]) ?  Insomnia ?  Anxiety ?  Hyperbilirubinemia ?  Elevated troponin ?  History of prostate cancer ?  Elevated serum creatinine ?  Acute combined systolic (congestive) and diastolic (congestive) heart failure (HCC) ?  Acute metabolic encephalopathy ?  ? ?Dionisio David, MD, FACC ?02/26/2022 ?11:28 AM ? ? ? ?  ?

## 2022-02-26 NOTE — Discharge Summary (Signed)
Physician Discharge Summary   Carl Mcclain  male DOB: [birth year]  QMV:[id number]  PCP: Mick Sell, MD  Admit date: 02/23/2022 Discharge date: 02/26/2022  Admitted From: home Disposition:  home CODE STATUS: Full code  Discharge Instructions     Discharge instructions   Complete by: As directed    You are discharged on oral lasix and Enalapril.  Please follow up with cardiology Dr. Welton Flakes on Tuesday 1 pm, and he will do more evaluation to determine why you have developed heart failure.  I gave you a prescription for 10 days of Xanax.  You will need to follow up with neurologist for further refills.   Dr. Darlin Priestly [facility] Course:  For full details, please see H&P, progress notes, consult notes and ancillary notes.  Briefly,  Carl Mcclain is a 76 y.o. m w/ COPD, anxiety and insomnia, prostate cancer s/p prostatectomy, Parkinsonism,  followed by neurologist Dr. Malvin Johns, last seen in February 2023 presented to the ED with a 2-day history of weakness, feeling shaky and unable to sit still, feeling like he is having an anxiety attack.  He also has episodes where he feels like his heart is racing and he has difficulty catching his breath.  He also reports ongoing difficulty sleeping.  Initial workup found Patient to have acute combined CHF.  cardiology consulted.   Acute combined systolic and diastolic heart failure Sinus tachycardia: Echo- LVEF 20-25%, G3 DD, global hypokinesia.  Unclear etiology, no prior hx of cardiac disease.  CTA no acute PE, TSH normal.   --cardiology consulted, with Dr. Welton Flakes --received IV Lasix 20 twice daily.  initiated on Entresto, however, BP too low to tolerate.  Pt was discharged on oral lasix 20 mg daily and enalapril 2.5 mg daily, per cardio rec. --outpatient f/u with Dr. Welton Flakes scheduled for further workup.  Elevated troponin Demand ischemia:  flat trops 21> 23 no delta, suspect demand ischemia in the setting of patient's acute  heart failure.  No chest pain.   Hyperbilirubinemia:  Likely from hepatic congestion from CHF.  LFTs stable   Parkinsonism:  Followed by Dr. Hale Bogus -continue home Sinemet   Delirium Acute metabolic encephalopathy Forgetfulness Anxiety disorder Insomnia Patient forgetful confused agitated at night suspect 2/2 his parkinsonism, anxiety issues and sundowning.  Wife reports he recently has been forgetful, has not been getting adequate sleep at home.  --pt recently received outpatient Rx for Xanax, which pt reported helped the most for his anxiety and sleep.  10 days of Xanax Rx provided at discharge.  Pt was advised to f/u with outpatient neuro for further management of his anxiety and insomnia.     AKI --Cr increased to 1.85, likely due to diuresis.  Diuretic reduced to oral lasix 20 mg daily at discharge, per cardio rec.   COPD:  Stable not in exacerbation   History of prostate cancer -FU OP   Discharge Diagnoses:  Principal Problem:   Acute combined systolic and diastolic congestive heart failure (HCC) Active Problems:   Palpitations   Elevated troponin   Hyperbilirubinemia   Parkinsonism (HCC)   Anxiety   Elevated serum creatinine   COPD (chronic obstructive pulmonary disease) (HCC)   Insomnia   History of prostate cancer   Acute combined systolic (congestive) and diastolic (congestive) heart failure (HCC)   Acute metabolic encephalopathy   30 Day Unplanned Readmission Risk Score    Flowsheet Row ED to Hosp-Admission (Current) from 02/23/2022 in [facility]  MED PCU  30 Day Unplanned Readmission Risk Score (%) 11.15 Filed at 02/26/2022 1200       This score is the patient's risk of an unplanned readmission within 30 days of being discharged (0 -100%). The score is based on dignosis, age, lab data, medications, orders, and past utilization.   Low:  0-14.9   Medium: 15-21.9   High: 22-29.9   Extreme: 30 and above         Discharge  Instructions:  Allergies as of 02/26/2022   No Known Allergies      Medication List     STOP taking these medications    gabapentin 100 MG capsule Commonly known as: NEURONTIN       TAKE these medications    albuterol 108 (90 Base) MCG/ACT inhaler Commonly known as: VENTOLIN HFA Inhale 2 puffs into the lungs every 6 (six) hours as needed for wheezing or shortness of breath.   ALPRAZolam 0.25 MG tablet Commonly known as: XANAX Take 1 tablet (0.25 mg total) by mouth at bedtime as needed for up to 10 days for anxiety.   carbidopa-levodopa 25-100 MG tablet Commonly known as: SINEMET IR Take 1-2 tablets by mouth See admin instructions. Take 2 tablets at 0600, take 2 tablets at 1000, 2 tablets at 1400, and 1 tablet 1800   Carbidopa-Levodopa ER 25-100 MG tablet controlled release Commonly known as: SINEMET CR Take 1 tablet by mouth 4 (four) times daily.   enalapril 2.5 MG tablet Commonly known as: VASOTEC Take 1 tablet (2.5 mg total) by mouth daily.   furosemide 20 MG tablet Commonly known as: Lasix Take 1 tablet (20 mg total) by mouth daily.   Spiriva Respimat 2.5 MCG/ACT Aers Generic drug: Tiotropium Bromide Monohydrate Inhale 2 puffs into the lungs daily.   traZODone 50 MG tablet Commonly known as: DESYREL Take 25 mg by mouth at bedtime.         Follow-up Information     Laurier Nancy, MD Follow up on 03/01/2022.   Specialty: Cardiology Why: 1 pm. Contact information: [address] [place] [address] [phone]                 No Known Allergies   The results of significant diagnostics from this hospitalization (including imaging, microbiology, ancillary and laboratory) are listed below for reference.   Consultations:   Procedures/Studies: DG Chest 2 View  Result Date: 02/23/2022 CLINICAL DATA:  Shortness of breath.  Weakness. EXAM: CHEST - 2 VIEW COMPARISON:  None available. FINDINGS: Upper normal heart size.The  cardiomediastinal contours are normal. The lungs are clear. Pulmonary vasculature is normal. No consolidation, pleural effusion, or pneumothorax. No acute osseous abnormalities are seen. IMPRESSION: Borderline cardiomegaly. No acute chest findings. Electronically Signed   By: Narda Rutherford M.D.   On: 02/23/2022 19:16   CT Angio Chest PE W and/or Wo Contrast  Result Date: 02/23/2022 CLINICAL DATA:  Weakness EXAM: CT ANGIOGRAPHY CHEST WITH CONTRAST TECHNIQUE: Multidetector CT imaging of the chest was performed using the standard protocol during bolus administration of intravenous contrast. Multiplanar CT image reconstructions and MIPs were obtained to evaluate the vascular anatomy. RADIATION DOSE REDUCTION: This exam was performed according to the departmental dose-optimization program which includes automated exposure control, adjustment of the mA and/or kV according to patient size and/or use of iterative reconstruction technique. CONTRAST:  75mL OMNIPAQUE IOHEXOL 350 MG/ML SOLN COMPARISON:  Chest x-ray 02/23/2022 FINDINGS: Cardiovascular: Satisfactory opacification of the pulmonary arteries to the segmental level. No evidence  of pulmonary embolism. Mild aortic atherosclerosis. No aneurysm. Borderline to mild cardiomegaly. Coronary vascular calcification. No pericardial effusion. Mediastinum/Nodes: Midline trachea. No thyroid mass. No suspicious lymph nodes. Esophagus within normal limits. Lungs/Pleura: No acute consolidation, pleural effusion, or pneumothorax. Upper Abdomen: No acute abnormality. Musculoskeletal: Bilateral gynecomastia. No acute osseous abnormality Review of the MIP images confirms the above findings. IMPRESSION: 1. Negative for acute pulmonary embolus. 2. Borderline to mild cardiomegaly without acute airspace disease Aortic Atherosclerosis (ICD10-I70.0). Electronically Signed   By: Jasmine Pang M.D.   On: 02/23/2022 19:53   ECHOCARDIOGRAM COMPLETE  Result Date: 02/24/2022     ECHOCARDIOGRAM REPORT   Patient Name:   ALDRIN MA Date of Exam: 02/24/2022 Medical Rec #:  [id number]     Height:       72.0 in Accession #:    [id number]    Weight:       150.0 lb Date of Birth:  Apr 14, 1946     BSA:          1.885 m Patient Age:    75 years      BP:           116/64 mmHg Patient Gender: M             HR:           73 bpm. Exam Location:  [facility] Procedure: 2D Echo, Cardiac Doppler and Color Doppler Indications:     CHF-acute diastolic I50.31  History:         Patient has no prior history of Echocardiogram examinations.                  COPD, Signs/Symptoms:Dyspnea; Risk Factors:Sleep Apnea and                  Pneumonia.  Sonographer:     Cristela Blue Referring Phys:  [id number] Andris Baumann Diagnosing Phys: Adrian Blackwater IMPRESSIONS  1. Left ventricular ejection fraction, by estimation, is 20 to 25%. The left ventricle has severely decreased function. The left ventricle demonstrates global hypokinesis. The left ventricular internal cavity size was severely dilated. Left ventricular diastolic parameters are consistent with Grade III diastolic dysfunction (restrictive).  2. Right ventricular systolic function is severely reduced. The right ventricular size is severely enlarged. Mildly increased right ventricular wall thickness.  3. Left atrial size was severely dilated.  4. Right atrial size was severely dilated.  5. The mitral valve is grossly normal. Moderate mitral valve regurgitation.  6. Tricuspid valve regurgitation is moderate.  7. The aortic valve is grossly normal. Aortic valve regurgitation is trivial. Aortic valve sclerosis is present, with no evidence of aortic valve stenosis. FINDINGS  Left Ventricle: Left ventricular ejection fraction, by estimation, is 20 to 25%. The left ventricle has severely decreased function. The left ventricle demonstrates global hypokinesis. The left ventricular internal cavity size was severely dilated. There is no left ventricular hypertrophy. Left ventricular  diastolic parameters are consistent with Grade III diastolic dysfunction (restrictive). Right Ventricle: The right ventricular size is severely enlarged. Mildly increased right ventricular wall thickness. Right ventricular systolic function is severely reduced. Left Atrium: Left atrial size was severely dilated. Right Atrium: Right atrial size was severely dilated. Pericardium: Trivial pericardial effusion is present. Mitral Valve: The mitral valve is grossly normal. Moderate mitral valve regurgitation. MV peak gradient, 6.1 mmHg. The mean mitral valve gradient is 3.0 mmHg. Tricuspid Valve: The tricuspid valve is grossly normal. Tricuspid valve regurgitation is moderate. Aortic Valve: The aortic valve is  grossly normal. Aortic valve regurgitation is trivial. Aortic valve sclerosis is present, with no evidence of aortic valve stenosis. Aortic valve mean gradient measures 2.0 mmHg. Aortic valve peak gradient measures 3.2 mmHg. Aortic valve area, by VTI measures 3.05 cm. Pulmonic Valve: The pulmonic valve was grossly normal. Pulmonic valve regurgitation is trivial. Aorta: The aortic root, ascending aorta and aortic arch are all structurally normal, with no evidence of dilitation or obstruction. IAS/Shunts: No atrial level shunt detected by color flow Doppler.  LEFT VENTRICLE PLAX 2D LVIDd:         6.20 cm      Diastology LVIDs:         5.55 cm      LV e' medial:    10.40 cm/s LV PW:         0.95 cm      LV E/e' medial:  4.7 LV IVS:        1.00 cm      LV e' lateral:   15.30 cm/s LVOT diam:     2.00 cm      LV E/e' lateral: 3.2 LV SV:         41 LV SV Index:   21 LVOT Area:     3.14 cm  LV Volumes (MOD) LV vol d, MOD A2C: 178.0 ml LV vol d, MOD A4C: 162.0 ml LV vol s, MOD A2C: 95.6 ml LV vol s, MOD A4C: 132.0 ml LV SV MOD A2C:     82.4 ml LV SV MOD A4C:     162.0 ml LV SV MOD BP:      57.6 ml RIGHT VENTRICLE RV S prime:     16.40 cm/s TAPSE (M-mode): 3.0 cm LEFT ATRIUM             Index        RIGHT ATRIUM            Index LA diam:        4.60 cm 2.44 cm/m   RA Area:     16.10 cm LA Vol (A2C):   43.1 ml 22.86 ml/m  RA Volume:   40.90 ml  21.69 ml/m LA Vol (A4C):   35.1 ml 18.62 ml/m LA Biplane Vol: 42.7 ml 22.65 ml/m  AORTIC VALVE                    PULMONIC VALVE AV Area (Vmax):    2.75 cm     PV Vmax:        0.65 m/s AV Area (Vmean):   2.44 cm     PV Vmean:       39.400 cm/s AV Area (VTI):     3.05 cm     PV VTI:         0.103 m AV Vmax:           88.90 cm/s   PV Peak grad:   1.7 mmHg AV Vmean:          61.600 cm/s  PV Mean grad:   1.0 mmHg AV VTI:            0.133 m      RVOT Peak grad: 3 mmHg AV Peak Grad:      3.2 mmHg AV Mean Grad:      2.0 mmHg LVOT Vmax:         77.70 cm/s LVOT Vmean:        47.900 cm/s LVOT VTI:  0.129 m LVOT/AV VTI ratio: 0.97  AORTA Ao Root diam: 3.10 cm MITRAL VALVE               TRICUSPID VALVE MV Area (PHT): 8.52 cm    TR Peak grad:   11.7 mmHg MV Area VTI:   2.70 cm    TR Vmax:        171.00 cm/s MV Peak grad:  6.1 mmHg MV Mean grad:  3.0 mmHg    SHUNTS MV Vmax:       1.23 m/s    Systemic VTI:  0.13 m MV Vmean:      76.9 cm/s   Systemic Diam: 2.00 cm MV Decel Time: 89 msec     Pulmonic VTI:  0.148 m MV E velocity: 49.30 cm/s MV A velocity: 95.10 cm/s MV E/A ratio:  0.52 Shaukat Khan Electronically signed by Adrian Blackwater Signature Date/Time: 02/24/2022/10:58:41 AM    Final       Labs: BNP (last 3 results) Recent Labs    02/23/22 1657  BNP 903.5*   Basic Metabolic Panel: Recent Labs  Lab 02/23/22 1650 02/23/22 1658 02/24/22 0411 02/26/22 0436  NA 133*  --  138 133*  K 4.2  --  4.0 4.3  CL 99  --  101 94*  CO2 26  --  29 30  GLUCOSE 101*  --  99 107*  BUN 26*  --  25* 51*  CREATININE 1.30*  --  1.33* 1.85*  CALCIUM 9.1  --  9.2 9.4  MG  --  2.2  --   --    Liver Function Tests: Recent Labs  Lab 02/23/22 1658 02/24/22 0411  AST 33 31  ALT 6 7  ALKPHOS 54 54  BILITOT 1.9* 2.5*  PROT 7.2 7.3  ALBUMIN 4.3 4.5   No results for input(s): LIPASE,  AMYLASE in the last 168 hours. No results for input(s): AMMONIA in the last 168 hours. CBC: Recent Labs  Lab 02/23/22 1650 02/24/22 0411 02/26/22 0436  WBC 6.8 7.4 7.3  HGB 14.3 14.7 16.1  HCT 43.8 44.0 48.7  MCV 95.2 94.4 94.6  PLT 171 179 191   Cardiac Enzymes: No results for input(s): CKTOTAL, CKMB, CKMBINDEX, TROPONINI in the last 168 hours. BNP: Invalid input(s): POCBNP CBG: No results for input(s): GLUCAP in the last 168 hours. D-Dimer No results for input(s): DDIMER in the last 72 hours. Hgb A1c No results for input(s): HGBA1C in the last 72 hours. Lipid Profile No results for input(s): CHOL, HDL, LDLCALC, TRIG, CHOLHDL, LDLDIRECT in the last 72 hours. Thyroid function studies Recent Labs    02/23/22 1658  TSH 1.700   Anemia work up No results for input(s): VITAMINB12, FOLATE, FERRITIN, TIBC, IRON, RETICCTPCT in the last 72 hours. Urinalysis    Component Value Date/Time   COLORURINE YELLOW (A) 02/23/2022 1839   APPEARANCEUR CLEAR (A) 02/23/2022 1839   LABSPEC 1.020 02/23/2022 1839   PHURINE 6.0 02/23/2022 1839   GLUCOSEU NEGATIVE 02/23/2022 1839   HGBUR SMALL (A) 02/23/2022 1839   BILIRUBINUR NEGATIVE 02/23/2022 1839   KETONESUR 5 (A) 02/23/2022 1839   PROTEINUR NEGATIVE 02/23/2022 1839   NITRITE NEGATIVE 02/23/2022 1839   LEUKOCYTESUR NEGATIVE 02/23/2022 1839   Sepsis Labs Invalid input(s): PROCALCITONIN,  WBC,  LACTICIDVEN Microbiology No results found for this or any previous visit (from the past 240 hour(s)).   Total time spend on discharging this patient, including the last patient exam, discussing the hospital stay, instructions for ongoing  care as it relates to all pertinent caregivers, as well as preparing the medical discharge records, prescriptions, and/or referrals as applicable, is 45 minutes.    Darlin Priestly, MD  [facility] 02/26/2022, 12:54 PM

## 2022-03-10 ENCOUNTER — Ambulatory Visit: Payer: Medicare Other | Admitting: Family

## 2022-03-10 NOTE — Progress Notes (Signed)
? Patient ID: Carl Mcclain, male    DOB: 26-Dec-1945, 76 y.o.   MRN: [id number] ? ?HPI ? ?Mr Bransfield is a 76 y/o male with a history of asthma, COPD, OSA, parkinson, HTN, GERD, previous tobacco use and chronic heart failure.  ? ?Echo report from 02/24/22 reviewed and showed an EF of 20-25% along with severe LAE and moderate MR/TR.  ? ?Admitted 02/23/22 due to weakness, SOB and feeling like he's having an anxiety attack. Found to be in HF.  Cardiology consult obtained. CTA negative for PE. Initially received IV lasix and transitioned to oral diuretic. Elevated troponin thought to be due to demand ischemia. Discharged after 3 days.  ? ?He presents today for his initial visit with a chief complaint of minimal fatigue with moderate exertion. He describes this as chronic in nature having been present for several years. He has associate cough, shortness of breath, palpitations, difficulty sleeping, anxiety and light-headedness along with this. He denies abdominal distention, pedal edema, chest pain, wheezing or weight gain.  ? ?He says that he was given entresto before and it dropped his blood pressure too much.  ? ?Past Medical History:  ?Diagnosis Date  ? Anxiety   ? Asthma   ? Cancer [facility])   ? prostate  ? CHF (congestive heart failure) ([place])   ? COPD (chronic obstructive pulmonary disease) ([place])   ? Dyspnea   ? GERD (gastroesophageal reflux disease)   ? Hypertension   ? OSA (obstructive sleep apnea)   ? Parkinson disease ([place])   ? Pneumonia   ? ?Past Surgical History:  ?Procedure Laterality Date  ? INGUINAL HERNIA REPAIR Right 11/18/2021  ? Procedure: HERNIA REPAIR INGUINAL ADULT;  Surgeon: Benjamine Sprague, DO;  Location: [facility] ORS;  Service: General;  Laterality: Right;  ? INSERTION OF MESH  11/18/2021  ? Procedure: INSERTION OF MESH;  Surgeon: Benjamine Sprague, DO;  Location: [facility] ORS;  Service: General;;  ? prostatectomy    ? Vastectomy    ? VEIN LIGATION AND STRIPPING    ? ?History reviewed. No pertinent family history. ?Social  History  ? ?Tobacco Use  ? Smoking status: Former  ?  Types: Cigarettes  ? Smokeless tobacco: Never  ?Substance Use Topics  ? Alcohol use: Never  ? ?No Known Allergies ?Prior to Admission medications   ?Medication Sig Start Date End Date Taking? Authorizing Provider  ?albuterol (VENTOLIN HFA) 108 (90 Base) MCG/ACT inhaler Inhale 2 puffs into the lungs every 6 (six) hours as needed for wheezing or shortness of breath.   Yes [provider]  ?carbidopa-levodopa (SINEMET IR) 25-100 MG tablet Take 1-2 tablets by mouth See admin instructions. Take 2 tablets at 0600, take 2 tablets at 1000, 2 tablets at 1400, and 1 tablet 1800   Yes [provider]  ?Carbidopa-Levodopa ER (SINEMET CR) 25-100 MG tablet controlled release Take 1 tablet by mouth 4 (four) times daily. 09/16/21  Yes [provider]  ?dapagliflozin propanediol (FARXIGA) 10 MG TABS tablet Take 10 mg by mouth daily.   Yes [provider]  ?enalapril (VASOTEC) 2.5 MG tablet Take 1 tablet (2.5 mg total) by mouth daily. 02/26/22 05/27/22 Yes Enzo Bi, MD  ?furosemide (LASIX) 20 MG tablet Take 1 tablet (20 mg total) by mouth daily. 02/26/22 05/27/22 Yes Enzo Bi, MD  ?Tiotropium Bromide Monohydrate (SPIRIVA RESPIMAT) 2.5 MCG/ACT AERS Inhale 2 puffs into the lungs daily.   Yes [provider]  ?traZODone (DESYREL) 50 MG tablet Take 25 mg by mouth at bedtime.  Yes [provider]  ? ?Review of Systems  ?Constitutional:  Positive for fatigue. Negative for appetite change.  ?HENT:  Positive for postnasal drip. Negative for congestion and sore throat.   ?Eyes: Negative.   ?Respiratory:  Positive for cough and shortness of breath (chronically). Negative for chest tightness and wheezing.   ?Cardiovascular:  Positive for palpitations. Negative for chest pain and leg swelling.  ?Gastrointestinal:  Negative for abdominal distention and abdominal pain.  ?Endocrine: Negative.   ?Genitourinary: Negative.   ?Musculoskeletal:   Negative for back pain and neck pain.  ?Skin: Negative.   ?Allergic/Immunologic: Negative.   ?Neurological:  Positive for light-headedness. Negative for dizziness.  ?Hematological:  Negative for adenopathy. Does not bruise/bleed easily.  ?Psychiatric/Behavioral:  Positive for sleep disturbance (chronically trouble due to anxiety). Negative for dysphoric mood. The patient is nervous/anxious.   ? ?Vitals:  ? 03/11/22 1103  ?BP: 132/76  ?Pulse: 84  ?Resp: 18  ?SpO2: 100%  ?Weight: 150 lb 6 oz (68.2 kg)  ?Height: 6' (1.829 m)  ? ?Wt Readings from Last 3 Encounters:  ?03/11/22 150 lb 6 oz (68.2 kg)  ?02/26/22 142 lb 8 oz (64.6 kg)  ?11/18/21 150 lb (68 kg)  ? ?Lab Results  ?Component Value Date  ? CREATININE 1.85 (H) 02/26/2022  ? CREATININE 1.33 (H) 02/24/2022  ? CREATININE 1.30 (H) 02/23/2022  ? ?Physical Exam ?Vitals and nursing note reviewed. Exam conducted with a chaperone present (wife).  ?Constitutional:   ?   Appearance: Normal appearance.  ?HENT:  ?   Head: Normocephalic and atraumatic.  ?Cardiovascular:  ?   Rate and Rhythm: Normal rate and regular rhythm.  ?Pulmonary:  ?   Effort: Pulmonary effort is normal. No respiratory distress.  ?   Breath sounds: No wheezing or rales.  ?Abdominal:  ?   General: There is no distension.  ?   Palpations: Abdomen is soft.  ?Musculoskeletal:     ?   General: No tenderness.  ?   Cervical back: Normal range of motion and neck supple.  ?   Right lower leg: Edema (trace around right ankle) present.  ?   Left lower leg: No edema.  ?Skin: ?   General: Skin is dry.  ?Neurological:  ?   General: No focal deficit present.  ?   Mental Status: He is alert and oriented to person, place, and time.  ?Psychiatric:     ?   Mood and Affect: Mood normal.     ?   Behavior: Behavior normal.     ?   Thought Content: Thought content normal.  ?  ?Assessment & Plan: ? ?1: Chronic heart failure with reduced ejection fraction- ?- NYHA class II ?- euvolemic today ?- weighing daily and understands to  call for an overnight weight gain of > 2 pounds or a weekly weight gain of > 5 pounds ?- not adding salt to his food after it's prepared; wife occasionally uses salt when cooking ?- saw cardiology Humphrey Rolls)  within the last couple of weeks and returns on 03/15/22 to review stress test results ?- on GDMT of farxiga and enalapril ?- says he was tried on entresto with resultant hypotension ?- advised to use the furosemide PRN for above weight gain, swelling or SOB ?- admits to not drinking much fluids and he was encouraged to drink ~ 64 ounces of fluids daily ?- BNP 02/23/22 was 903.5 ? ?2: HTN- ?- BP mildly elevated (132/76) ?- saw PCP Fulton Mole) 02/08/22 ?- BMP 02/26/22  reviewed and showed sodium 133, potassium 4.3, creatinine of 1.85 and GFR 37 ? ?3: COPD- ?- using albuterol PRN ? ?4: Anxiety- ?- taking trazodone at bedtime ? ?5: Parkinson's- ?- currently taking carbidopa-levodopa ?- saw neurology Melrose Nakayama) 12/22/21 ?- says that he was getting weaned off of carbidopa and then "all these problems started" ? ? ?Medication bottles reviewed.  ? ?Return in 6 weeks, sooner if needed.  ? ?

## 2022-03-11 ENCOUNTER — Encounter: Payer: Self-pay | Admitting: Family

## 2022-03-11 ENCOUNTER — Ambulatory Visit: Payer: Medicare Other | Attending: Family | Admitting: Family

## 2022-03-11 VITALS — BP 132/76 | HR 84 | Resp 18 | Ht 72.0 in | Wt 150.4 lb

## 2022-03-11 DIAGNOSIS — Z79899 Other long term (current) drug therapy: Secondary | ICD-10-CM | POA: Insufficient documentation

## 2022-03-11 DIAGNOSIS — R42 Dizziness and giddiness: Secondary | ICD-10-CM | POA: Diagnosis not present

## 2022-03-11 DIAGNOSIS — R002 Palpitations: Secondary | ICD-10-CM | POA: Insufficient documentation

## 2022-03-11 DIAGNOSIS — I110 Hypertensive heart disease with heart failure: Secondary | ICD-10-CM | POA: Diagnosis not present

## 2022-03-11 DIAGNOSIS — I10 Essential (primary) hypertension: Secondary | ICD-10-CM

## 2022-03-11 DIAGNOSIS — Z7984 Long term (current) use of oral hypoglycemic drugs: Secondary | ICD-10-CM | POA: Insufficient documentation

## 2022-03-11 DIAGNOSIS — G20 Parkinson's disease: Secondary | ICD-10-CM | POA: Diagnosis not present

## 2022-03-11 DIAGNOSIS — Z87891 Personal history of nicotine dependence: Secondary | ICD-10-CM | POA: Insufficient documentation

## 2022-03-11 DIAGNOSIS — K219 Gastro-esophageal reflux disease without esophagitis: Secondary | ICD-10-CM | POA: Insufficient documentation

## 2022-03-11 DIAGNOSIS — F419 Anxiety disorder, unspecified: Secondary | ICD-10-CM | POA: Diagnosis not present

## 2022-03-11 DIAGNOSIS — G20C Parkinsonism, unspecified: Secondary | ICD-10-CM

## 2022-03-11 DIAGNOSIS — J449 Chronic obstructive pulmonary disease, unspecified: Secondary | ICD-10-CM

## 2022-03-11 DIAGNOSIS — R5383 Other fatigue: Secondary | ICD-10-CM | POA: Diagnosis present

## 2022-03-11 DIAGNOSIS — I5022 Chronic systolic (congestive) heart failure: Secondary | ICD-10-CM | POA: Diagnosis not present

## 2022-03-11 NOTE — Patient Instructions (Addendum)
Continue weighing daily and call for an overnight weight gain of 3 pounds or more or a weekly weight gain of more than 5 pounds. ? ? ?If you have voicemail, please make sure your mailbox is cleaned out so that we may leave a message and please make sure to listen to any voicemails.  ? ? ?Start using your furosemide daily only if you need it ? ? ?Try to drink between 60-64 ounces of fluids daily ?

## 2022-04-18 ENCOUNTER — Encounter: Payer: Self-pay | Admitting: Internal Medicine

## 2022-04-18 ENCOUNTER — Ambulatory Visit
Admission: RE | Admit: 2022-04-18 | Discharge: 2022-04-18 | Disposition: A | Discharge status: Home / Self Care | Payer: Medicare Other | Source: Ambulatory Visit | Attending: Internal Medicine | Admitting: Internal Medicine

## 2022-04-18 ENCOUNTER — Encounter
Admission: RE | Disposition: A | Discharge status: Home / Self Care | Payer: Self-pay | Source: Ambulatory Visit | Attending: Internal Medicine

## 2022-04-18 ENCOUNTER — Other Ambulatory Visit: Payer: Self-pay

## 2022-04-18 DIAGNOSIS — R9430 Abnormal result of cardiovascular function study, unspecified: Secondary | ICD-10-CM | POA: Diagnosis not present

## 2022-04-18 DIAGNOSIS — Z87891 Personal history of nicotine dependence: Secondary | ICD-10-CM | POA: Insufficient documentation

## 2022-04-18 DIAGNOSIS — I2510 Atherosclerotic heart disease of native coronary artery without angina pectoris: Secondary | ICD-10-CM | POA: Insufficient documentation

## 2022-04-18 DIAGNOSIS — R079 Chest pain, unspecified: Secondary | ICD-10-CM

## 2022-04-18 DIAGNOSIS — I5021 Acute systolic (congestive) heart failure: Secondary | ICD-10-CM | POA: Insufficient documentation

## 2022-04-18 DIAGNOSIS — I110 Hypertensive heart disease with heart failure: Secondary | ICD-10-CM | POA: Diagnosis not present

## 2022-04-18 DIAGNOSIS — I447 Left bundle-branch block, unspecified: Secondary | ICD-10-CM | POA: Diagnosis not present

## 2022-04-18 HISTORY — PX: LEFT HEART CATH AND CORONARY ANGIOGRAPHY: CATH118249

## 2022-04-18 SURGERY — LEFT HEART CATH AND CORONARY ANGIOGRAPHY
Anesthesia: Moderate Sedation

## 2022-04-18 MED ORDER — HEPARIN SODIUM (PORCINE) 1000 UNIT/ML IJ SOLN
INTRAMUSCULAR | Status: DC | PRN
  Administered 2022-04-18: 3000 [IU] via INTRAVENOUS

## 2022-04-18 MED ORDER — HEPARIN SODIUM (PORCINE) 1000 UNIT/ML IJ SOLN
INTRAMUSCULAR | Status: AC
  Filled 2022-04-18: qty 10

## 2022-04-18 MED ORDER — SODIUM CHLORIDE 0.9 % WEIGHT BASED INFUSION: 1.0000 mL/kg/h | INTRAVENOUS | Status: DC

## 2022-04-18 MED ORDER — ASPIRIN 81 MG PO CHEW
CHEWABLE_TABLET | ORAL | Status: AC
  Administered 2022-04-18: 81 mg via ORAL
  Filled 2022-04-18: qty 1

## 2022-04-18 MED ORDER — IOHEXOL 300 MG/ML  SOLN
INTRAMUSCULAR | Status: DC | PRN
  Administered 2022-04-18: 50 mL

## 2022-04-18 MED ORDER — LIDOCAINE HCL 1 % IJ SOLN
INTRAMUSCULAR | Status: AC
  Filled 2022-04-18: qty 20

## 2022-04-18 MED ORDER — LIDOCAINE HCL (PF) 1 % IJ SOLN
INTRAMUSCULAR | Status: DC | PRN
  Administered 2022-04-18: 2 mL

## 2022-04-18 MED ORDER — HEPARIN (PORCINE) IN NACL 1000-0.9 UT/500ML-% IV SOLN
INTRAVENOUS | Status: DC | PRN
  Administered 2022-04-18 (×2): 500 mL

## 2022-04-18 MED ORDER — SODIUM CHLORIDE 0.9% FLUSH: 3.0000 mL | Freq: Two times a day (BID) | INTRAVENOUS | Status: DC

## 2022-04-18 MED ORDER — VERAPAMIL HCL 2.5 MG/ML IV SOLN
INTRAVENOUS | Status: DC | PRN
  Administered 2022-04-18: 2.5 mg via INTRA_ARTERIAL

## 2022-04-18 MED ORDER — LABETALOL HCL 5 MG/ML IV SOLN: 10.0000 mg | INTRAVENOUS | Status: DC | PRN

## 2022-04-18 MED ORDER — FENTANYL CITRATE (PF) 100 MCG/2ML IJ SOLN
INTRAMUSCULAR | Status: DC | PRN
Start: 2022-04-18 — End: 2022-04-18
  Administered 2022-04-18: 25 ug via INTRAVENOUS

## 2022-04-18 MED ORDER — ASPIRIN 81 MG PO CHEW: 81.0000 mg | CHEWABLE_TABLET | ORAL | Status: AC

## 2022-04-18 MED ORDER — SODIUM CHLORIDE 0.9 % IV SOLN: 250.0000 mL | INTRAVENOUS | Status: DC | PRN

## 2022-04-18 MED ORDER — HYDRALAZINE HCL 20 MG/ML IJ SOLN: 10.0000 mg | INTRAMUSCULAR | Status: DC | PRN

## 2022-04-18 MED ORDER — VERAPAMIL HCL 2.5 MG/ML IV SOLN
INTRAVENOUS | Status: AC
  Filled 2022-04-18: qty 2

## 2022-04-18 MED ORDER — SODIUM CHLORIDE 0.9 % WEIGHT BASED INFUSION
3.0000 mL/kg/h | INTRAVENOUS | Status: AC
  Administered 2022-04-18: 3 mL/kg/h via INTRAVENOUS

## 2022-04-18 MED ORDER — SODIUM CHLORIDE 0.9 % WEIGHT BASED INFUSION
1.0000 mL/kg/h | INTRAVENOUS | Status: DC
  Administered 2022-04-18: 1 mL/kg/h via INTRAVENOUS

## 2022-04-18 MED ORDER — ACETAMINOPHEN 325 MG PO TABS: 650.0000 mg | ORAL_TABLET | ORAL | Status: DC | PRN

## 2022-04-18 MED ORDER — MIDAZOLAM HCL 2 MG/2ML IJ SOLN
INTRAMUSCULAR | Status: DC | PRN
  Administered 2022-04-18: 1 mg via INTRAVENOUS

## 2022-04-18 MED ORDER — FENTANYL CITRATE (PF) 100 MCG/2ML IJ SOLN
INTRAMUSCULAR | Status: AC
  Filled 2022-04-18: qty 2

## 2022-04-18 MED ORDER — ONDANSETRON HCL 4 MG/2ML IJ SOLN: 4.0000 mg | Freq: Four times a day (QID) | INTRAMUSCULAR | Status: DC | PRN

## 2022-04-18 MED ORDER — SODIUM CHLORIDE 0.9% FLUSH: 3.0000 mL | INTRAVENOUS | Status: DC | PRN

## 2022-04-18 MED ORDER — MIDAZOLAM HCL 2 MG/2ML IJ SOLN
INTRAMUSCULAR | Status: AC
  Filled 2022-04-18: qty 2

## 2022-04-18 SURGICAL SUPPLY — 10 items
BAND ZEPHYR COMPRESS 30 LONG (HEMOSTASIS) ×1 IMPLANT
CATH 5FR JL3.5 JR4 ANG PIG MP (CATHETERS) ×1 IMPLANT
DRAPE BRACHIAL (DRAPES) ×1 IMPLANT
GLIDESHEATH SLEND SS 6F .021 (SHEATH) ×1 IMPLANT
GUIDEWIRE INQWIRE 1.5J.035X260 (WIRE) IMPLANT
INQWIRE 1.5J .035X260CM (WIRE) ×2
PACK CARDIAC CATH (CUSTOM PROCEDURE TRAY) ×2 IMPLANT
PROTECTION STATION PRESSURIZED (MISCELLANEOUS) ×2
SET ATX SIMPLICITY (MISCELLANEOUS) ×1 IMPLANT
STATION PROTECTION PRESSURIZED (MISCELLANEOUS) IMPLANT

## 2022-04-19 ENCOUNTER — Encounter: Payer: Self-pay | Admitting: Internal Medicine

## 2022-04-26 ENCOUNTER — Ambulatory Visit: Payer: Medicare Other | Admitting: Family

## 2022-04-29 ENCOUNTER — Encounter: Payer: Self-pay | Admitting: *Deleted

## 2022-04-29 ENCOUNTER — Encounter: Payer: Medicare Other | Attending: Internal Medicine | Admitting: *Deleted

## 2022-04-29 DIAGNOSIS — I5022 Chronic systolic (congestive) heart failure: Secondary | ICD-10-CM

## 2022-04-29 NOTE — Progress Notes (Signed)
Virtual orientation call completed today. he has an appointment on Date: 05/17/2022  for EP eval and gym Orientation.  Documentation of diagnosis can be found in [facility] 04/18/2022.

## 2022-05-13 ENCOUNTER — Ambulatory Visit: Payer: Medicare Other | Attending: Family | Admitting: Family

## 2022-05-13 ENCOUNTER — Encounter: Payer: Self-pay | Admitting: Family

## 2022-05-13 VITALS — BP 113/72 | HR 70 | Resp 16 | Ht 72.0 in | Wt 143.4 lb

## 2022-05-13 DIAGNOSIS — I5022 Chronic systolic (congestive) heart failure: Secondary | ICD-10-CM

## 2022-05-13 DIAGNOSIS — I959 Hypotension, unspecified: Secondary | ICD-10-CM | POA: Insufficient documentation

## 2022-05-13 DIAGNOSIS — J449 Chronic obstructive pulmonary disease, unspecified: Secondary | ICD-10-CM

## 2022-05-13 DIAGNOSIS — G20 Parkinson's disease: Secondary | ICD-10-CM

## 2022-05-13 DIAGNOSIS — F419 Anxiety disorder, unspecified: Secondary | ICD-10-CM | POA: Diagnosis not present

## 2022-05-13 DIAGNOSIS — I447 Left bundle-branch block, unspecified: Secondary | ICD-10-CM | POA: Diagnosis not present

## 2022-05-13 DIAGNOSIS — G4733 Obstructive sleep apnea (adult) (pediatric): Secondary | ICD-10-CM | POA: Diagnosis not present

## 2022-05-13 DIAGNOSIS — I10 Essential (primary) hypertension: Secondary | ICD-10-CM

## 2022-05-13 DIAGNOSIS — I110 Hypertensive heart disease with heart failure: Secondary | ICD-10-CM | POA: Insufficient documentation

## 2022-05-13 DIAGNOSIS — Z87891 Personal history of nicotine dependence: Secondary | ICD-10-CM | POA: Diagnosis not present

## 2022-05-13 DIAGNOSIS — K219 Gastro-esophageal reflux disease without esophagitis: Secondary | ICD-10-CM | POA: Diagnosis not present

## 2022-05-13 DIAGNOSIS — G20C Parkinsonism, unspecified: Secondary | ICD-10-CM

## 2022-05-13 NOTE — Patient Instructions (Addendum)
Continue weighing daily and call for an overnight weight gain of 3 pounds or more or a weekly weight gain of more than 5 pounds.   If you have voicemail, please make sure your mailbox is cleaned out so that we may leave a message and please make sure to listen to any voicemails.    Get compression socks and put them on every day with removal at bedtime.    Call us in the future if you need [place] for anything

## 2022-05-13 NOTE — Progress Notes (Signed)
Carl Mcclain ID: Carl Mcclain, male    DOB: 08-Sep-1946, 76 y.o.   MRN: [id number]  HPI  Carl Mcclain is a 76 y/o male with a history of asthma, COPD, OSA, parkinson, HTN, GERD, previous tobacco use and chronic heart failure.   Echo report from 02/24/22 reviewed and showed an EF of 20-25% along with severe LAE and moderate Carl/TR.   LHC done 04/18/22 and showed:   Ost LAD to Prox LAD lesion is 10% stenosed.   Prox RCA lesion is 10% stenosed.   Ost Cx to Prox Cx lesion is 5% stenosed.   There is severe left ventricular systolic dysfunction.   LV end diastolic pressure is normal.   The left ventricular ejection fraction is 25-35% by visual estimate.  Left ventricle with global dysfunction and left bundle branch block with ejection fraction of 20% by echocardiogram Minimal atherosclerosis and calcification of coronary arteries with 0% stenosis  Admitted 02/23/22 due to weakness, SOB and feeling like Carl Mcclain's having an anxiety attack. Found to be in HF. Cardiology consult obtained. CTA negative for PE. Initially received IV lasix and transitioned to oral diuretic. Elevated troponin thought to be due to demand ischemia. Discharged after 3 days.   Carl Mcclain presents today for a follow-up visit with a chief complaint of minimal shortness of breath upon moderate exertion. Describes this as chronic in nature. Carl Mcclain has associated fatigue, cough, palpitations, difficulty sleeping, anxiety and light-headedness along with this. Carl Mcclain denies abdominal distention, pedal edema, chest pain, wheezing or weight gain.   Carl Mcclain Carl Mcclain says that Carl Mcclain is sleeping during the day and then is up all night. Carl Mcclain tried staying up for part of a day but then gave up and Carl Mcclain voices frustration that Carl Mcclain's not trying to get Carl Mcclain sleeping schedule adjusted.   Carl Mcclain admits that Carl Mcclain's very anxious about everything and Carl Mcclain understands that there's not much else that can be done so Carl Mcclain feels like Carl Mcclain's just going to "live until I die"  Supposed to be starting  cardiac rehab soon.   Past Medical History:  Diagnosis Date   Anxiety    Asthma    Cancer [facility])    prostate   CHF (congestive heart failure) (HCC)    COPD (chronic obstructive pulmonary disease) (HCC)    Dyspnea    GERD (gastroesophageal reflux disease)    Hypertension    OSA (obstructive sleep apnea)    Parkinson disease ([place])    Pneumonia    Past Surgical History:  Procedure Laterality Date   INGUINAL HERNIA REPAIR Right 11/18/2021   Procedure: HERNIA REPAIR INGUINAL ADULT;  Surgeon: Benjamine Sprague, DO;  Location: [facility] ORS;  Service: General;  Laterality: Right;   INSERTION OF MESH  11/18/2021   Procedure: INSERTION OF MESH;  Surgeon: Benjamine Sprague, DO;  Location: [facility] ORS;  Service: General;;   LEFT HEART CATH AND CORONARY ANGIOGRAPHY N/A 04/18/2022   Procedure: LEFT HEART CATH AND CORONARY ANGIOGRAPHY;  Surgeon: Corey Skains, MD;  Location: [facility];  Service: Cardiovascular;  Laterality: N/A;   prostatectomy     Vastectomy     VEIN LIGATION AND STRIPPING     No family history on file. Social History   Tobacco Use   Smoking status: Former    Packs/day: 1.00    Years: 20.00    Total pack years: 20.00    Types: Cigarettes    Quit date: 10/31/1978    Years since quitting: 43.5   Smokeless tobacco: Never  Substance Use  Topics   Alcohol use: Never   No Known Allergies  Prior to Admission medications   Medication Sig Start Date End Date Taking? Authorizing Provider  albuterol (VENTOLIN HFA) 108 (90 Base) MCG/ACT inhaler Inhale 2 puffs into the lungs every 6 (six) hours as needed for wheezing or shortness of breath.   Yes [provider]  carbidopa-levodopa (SINEMET IR) 25-100 MG tablet Take 1-2 tablets by mouth See admin instructions. Take 2 tablets at 0600, take 2 tablets at 1000, 2 tablets at 1400, and 1 tablet 1800   Yes [provider]  Carbidopa-Levodopa ER (SINEMET CR) 25-100 MG tablet controlled release Take 1 tablet by mouth 4  (four) times daily. 09/16/21  Yes [provider]  carvedilol (COREG) 3.125 MG tablet Take 3.125 mg by mouth 2 (two) times daily. 04/11/22  Yes [provider]  dapagliflozin propanediol (FARXIGA) 10 MG TABS tablet Take 10 mg by mouth daily.   Yes [provider]  enalapril (VASOTEC) 2.5 MG tablet Take 1 tablet (2.5 mg total) by mouth daily. 02/26/22 05/27/22 Yes Enzo Bi, MD  furosemide (LASIX) 20 MG tablet Take 1 tablet (20 mg total) by mouth daily. 02/26/22 05/27/22 Yes Enzo Bi, MD    Review of Systems  Constitutional:  Positive for fatigue. Negative for appetite change.  HENT:  Positive for postnasal drip. Negative for congestion and sore throat.   Eyes: Negative.   Respiratory:  Positive for cough and shortness of breath (chronically). Negative for chest tightness and wheezing.   Cardiovascular:  Positive for palpitations. Negative for chest pain and leg swelling.  Gastrointestinal:  Negative for abdominal distention and abdominal pain.  Endocrine: Negative.   Genitourinary: Negative.   Musculoskeletal:  Negative for back pain and neck pain.  Skin: Negative.   Allergic/Immunologic: Negative.   Neurological:  Positive for light-headedness. Negative for dizziness.  Hematological:  Negative for adenopathy. Does not bruise/bleed easily.  Psychiatric/Behavioral:  Positive for sleep disturbance (chronically trouble due to anxiety). Negative for dysphoric mood. The Carl Mcclain is nervous/anxious.    Vitals:   05/13/22 1259  BP: 113/72  Pulse: 70  Resp: 16  SpO2: 98%  Weight: 143 lb 6 oz (65 kg)  Height: 6' (1.829 m)   Wt Readings from Last 3 Encounters:  05/13/22 143 lb 6 oz (65 kg)  04/18/22 151 lb 0.2 oz (68.5 kg)  03/11/22 150 lb 6 oz (68.2 kg)   Lab Results  Component Value Date   CREATININE 1.85 (H) 02/26/2022   CREATININE 1.33 (H) 02/24/2022   CREATININE 1.30 (H) 02/23/2022   Physical Exam Vitals and nursing note reviewed. Exam conducted with a  chaperone present (Carl Mcclain).  Constitutional:      Appearance: Normal appearance.  HENT:     Head: Normocephalic and atraumatic.  Cardiovascular:     Rate and Rhythm: Normal rate and regular rhythm.  Pulmonary:     Effort: Pulmonary effort is normal. No respiratory distress.     Breath sounds: No wheezing or rales.  Abdominal:     General: There is no distension.     Palpations: Abdomen is soft.  Musculoskeletal:        General: No tenderness.     Cervical back: Normal range of motion and neck supple.     Right lower leg: No tenderness. Edema (1+ pitting) present.     Left lower leg: No tenderness. Edema (trace pitting) present.  Skin:    General: Skin is dry.  Neurological:     General:  No focal deficit present.     Mental Status: Carl Mcclain is alert and oriented to person, place, and time.  Psychiatric:        Mood and Affect: Mood normal.        Behavior: Behavior normal.        Thought Content: Thought content normal.     Assessment & Plan:  1: Chronic heart failure with reduced ejection fraction- - NYHA class II - euvolemic today - weighing daily and understands to call for an overnight weight gain of > 2 pounds or a weekly weight gain of > 5 pounds - weight down 7 pounds from last visit here 2 months ago - not adding salt to Carl Mcclain food after it's prepared; Carl Mcclain occasionally uses salt when cooking - saw cardiology Nehemiah Massed) 05/10/22 - on GDMT of carvedilol, farxiga and enalapril - says Carl Mcclain was tried on entresto with resultant hypotension - admits to not drinking much fluids and Carl Mcclain was encouraged to drink ~ 64 ounces of fluids daily - get compression socks and put them on daily - reviewed the importance of trying to get Carl Mcclain sleep schedule flipped back to nights by trying to stay awake more during the day and that it may take a week or more before Carl Mcclain body gets used to sleeping at night versus the daytime - getting ready to start cardiac rehab - BNP 02/23/22 was 903.5  2: HTN- - BP  looks good (113/72) - saw PCP Fulton Mole) 02/08/22 - BMP 04/11/22 reviewed and showed sodium 140, potassium 4.2, creatinine of 1.6 and GFR 42  3: COPD- - using albuterol PRN  4: Anxiety- - taking trazodone at bedtime - has xanax at home but doesn't want to use it all because Carl Mcclain has security having it on hand "just in case" - encouraged him to make an appointment with PCP to discuss treatment of Carl Mcclain anxiety  5: Parkinson's- - currently taking carbidopa-levodopa - saw neurology Melrose Nakayama) 03/21/22 - says that Carl Mcclain was getting weaned off of carbidopa and then "all these problems started"   Medication bottles reviewed.   Due to HF stability, will not make a return appointment at this time. Advised Carl Mcclain to follow closely with PCP and cardiology but that Carl Mcclain could call back at anytime for questions or to make another appointment. Carl Mcclain and Carl Mcclain were comfortable with this plan.

## 2022-05-17 ENCOUNTER — Ambulatory Visit: Payer: Medicare Other

## 2023-05-10 ENCOUNTER — Emergency Department
Admission: EM | Admit: 2023-05-10 | Discharge: 2023-05-10 | Disposition: A | Discharge status: Home / Self Care | Payer: Medicare Other | Attending: Emergency Medicine | Admitting: Emergency Medicine

## 2023-05-10 ENCOUNTER — Other Ambulatory Visit: Payer: Self-pay

## 2023-05-10 DIAGNOSIS — E860 Dehydration: Secondary | ICD-10-CM | POA: Diagnosis not present

## 2023-05-10 DIAGNOSIS — R55 Syncope and collapse: Secondary | ICD-10-CM | POA: Diagnosis present

## 2023-05-10 DIAGNOSIS — I9589 Other hypotension: Secondary | ICD-10-CM | POA: Diagnosis not present

## 2023-05-10 DIAGNOSIS — R531 Weakness: Secondary | ICD-10-CM

## 2023-05-10 DIAGNOSIS — I440 Atrioventricular block, first degree: Secondary | ICD-10-CM | POA: Diagnosis not present

## 2023-05-10 LAB — BASIC METABOLIC PANEL
Anion gap: 8 (ref 5–15)
BUN: 46 mg/dL — ABNORMAL HIGH (ref 8–23)
CO2: 24 mmol/L (ref 22–32)
Calcium: 8.2 mg/dL — ABNORMAL LOW (ref 8.9–10.3)
Chloride: 106 mmol/L (ref 98–111)
Creatinine, Ser: 2.46 mg/dL — ABNORMAL HIGH (ref 0.61–1.24)
GFR, Estimated: 26 mL/min — ABNORMAL LOW (ref >60)
Glucose, Bld: 115 mg/dL — ABNORMAL HIGH (ref 70–99)
Potassium: 4 mmol/L (ref 3.5–5.1)
Sodium: 138 mmol/L (ref 135–145)

## 2023-05-10 LAB — CBC
HCT: 33.8 % — ABNORMAL LOW (ref 39.0–52.0)
Hemoglobin: 11.2 g/dL — ABNORMAL LOW (ref 13.0–17.0)
MCH: 31.4 pg (ref 26.0–34.0)
MCHC: 33.1 g/dL (ref 30.0–36.0)
MCV: 94.7 fL (ref 80.0–100.0)
Platelets: 164 10*3/uL (ref 150–400)
RBC: 3.57 MIL/uL — ABNORMAL LOW (ref 4.22–5.81)
RDW: 14 % (ref 11.5–15.5)
WBC: 7.8 10*3/uL (ref 4.0–10.5)
nRBC: 0 % (ref 0.0–0.2)

## 2023-05-10 MED ORDER — SODIUM CHLORIDE 0.9 % IV BOLUS
500.0000 mL | Freq: Once | INTRAVENOUS | Status: AC
  Administered 2023-05-10: 500 mL via INTRAVENOUS

## 2023-05-10 NOTE — Discharge Instructions (Signed)
As we discussed you can try going a few days without the blood pressure medication and lasix and see how that affects the weakness. Please be sure to follow up with your primary care physician to recheck blood work and blood pressure.

## 2023-05-10 NOTE — ED Triage Notes (Signed)
Pt was [place] a local restura[place] and fell and was very weak. Per EMS pt sys pressure was in the lower 80's. Pt was found to be diaphoretic per EMS. Pt is resting on ED stretcher at this time with VS WNL.

## 2023-05-10 NOTE — ED Provider Notes (Signed)
[facility] Provider Note    Event Date/Time   First MD Initiated Contact with Patient 05/10/23 1459     (approximate)   History   Fall and Weakness   HPI  Carl Mcclain is a 77 y.o. male  who presents to the emergency department today via EMS after an episode of weakness and near syncope. History is obtained from wife at bedside. She states that the patient frequently has episodes of weakness where he will collapse. It has become a daily occurrence. He seems to be weakest in the middle of the day, with decent strength in the morning and evening. The patient does not complain of any pain during these episodes. Wife states that blood pressure typically runs low. Patient is on blood pressure medication.       Physical Exam   Triage Vital Signs: ED Triage Vitals  Enc Vitals Group     BP 05/10/23 1445 (!) 119/58     Pulse Rate 05/10/23 1445 77     Resp 05/10/23 1445 13     Temp 05/10/23 1445 98 F (36.7 C)     Temp Source 05/10/23 1445 Oral     SpO2 05/10/23 1445 94 %     Weight 05/10/23 1443 149 lb 14.6 oz (68 kg)     Height 05/10/23 1443 6' (1.829 m)     Head Circumference --      Peak Flow --      Pain Score 05/10/23 1443 0     Pain Loc --      Pain Edu? --      Excl. in GC? --     Most recent vital signs: Vitals:   05/10/23 1445  BP: (!) 119/58  Pulse: 77  Resp: 13  Temp: 98 F (36.7 C)  SpO2: 94%   General: Awake, alert, not completely oriented. CV:  Good peripheral perfusion. Regular rate and rhythm. Resp:  Normal effort. Lungs clear. Abd:  No distention.    ED Results / Procedures / Treatments   Labs (all labs ordered are listed, but only abnormal results are displayed) Labs Reviewed  BASIC METABOLIC PANEL - Abnormal; Notable for the following components:      Result Value   Glucose, Bld 115 (*)    BUN 46 (*)    Creatinine, Ser 2.46 (*)    Calcium 8.2 (*)    GFR, Estimated 26 (*)    All other components within normal  limits  CBC - Abnormal; Notable for the following components:   RBC 3.57 (*)    Hemoglobin 11.2 (*)    HCT 33.8 (*)    All other components within normal limits  URINALYSIS, ROUTINE W REFLEX MICROSCOPIC  CBG MONITORING, ED     EKG  I, Phineas Semen, attending physician, personally viewed and interpreted this EKG  EKG Time: 1437 Rate: 81 Rhythm: sinus rhythm with 1st degree av block Axis: left axis deviation Intervals: qtc 501 QRS: LBBB ST changes: no st elevation Impression: abnormal ekg   RADIOLOGY None  PROCEDURES:  Critical Care performed: No    MEDICATIONS ORDERED IN ED: Medications - No data to display   IMPRESSION / MDM / ASSESSMENT AND PLAN / ED COURSE  I reviewed the triage vital signs and the nursing notes.                              Differential diagnosis includes, but is  not limited to, dehydration, anemia, infection, medication   Patient's presentation is most consistent with acute presentation with potential threat to life or bodily function.   The patient is on the cardiac monitor to evaluate for evidence of arrhythmia and/or significant heart rate changes.  Patient presented to the emergency department today because of concerns for episode of weakness and near syncope.  Patient has been having similar episodes of recently daily per wife.  On exam patient is awake and alert.  Blood work here without concerning anemia.  There is some elevation of creatinine concerning for dehydration.  Patient was given IV fluids and then was able to ambulate without difficulty.  This time I do think patient's symptoms might be related to medication use.  I discussed with wife holding of blood pressure medication for a couple of days to see if he improved.  Additionally discussed possibly holding Lasix given dehydration.  Did encourage patient to follow-up closely with primary care to further recheck blood pressure and blood work.      FINAL CLINICAL  IMPRESSION(S) / ED DIAGNOSES   Final diagnoses:  Weakness  Dehydration  Other specified hypotension      Note:  This document was prepared using Dragon voice recognition software and may include unintentional dictation errors.    Phineas Semen, MD 05/10/23 [phone]

## 2023-09-22 ENCOUNTER — Encounter: Payer: Self-pay | Admitting: Emergency Medicine

## 2023-09-22 ENCOUNTER — Other Ambulatory Visit: Payer: Self-pay

## 2023-09-22 ENCOUNTER — Emergency Department
Admission: EM | Admit: 2023-09-22 | Discharge: 2023-09-22 | Disposition: A | Discharge status: Home / Self Care | Payer: Medicare Other | Attending: Emergency Medicine | Admitting: Emergency Medicine

## 2023-09-22 ENCOUNTER — Emergency Department: Payer: Medicare Other

## 2023-09-22 DIAGNOSIS — Z8546 Personal history of malignant neoplasm of prostate: Secondary | ICD-10-CM | POA: Diagnosis not present

## 2023-09-22 DIAGNOSIS — R339 Retention of urine, unspecified: Secondary | ICD-10-CM | POA: Insufficient documentation

## 2023-09-22 DIAGNOSIS — R1031 Right lower quadrant pain: Secondary | ICD-10-CM | POA: Insufficient documentation

## 2023-09-22 DIAGNOSIS — G20A1 Parkinson's disease without dyskinesia, without mention of fluctuations: Secondary | ICD-10-CM | POA: Insufficient documentation

## 2023-09-22 DIAGNOSIS — I110 Hypertensive heart disease with heart failure: Secondary | ICD-10-CM | POA: Insufficient documentation

## 2023-09-22 DIAGNOSIS — I509 Heart failure, unspecified: Secondary | ICD-10-CM | POA: Diagnosis not present

## 2023-09-22 DIAGNOSIS — J449 Chronic obstructive pulmonary disease, unspecified: Secondary | ICD-10-CM | POA: Diagnosis not present

## 2023-09-22 DIAGNOSIS — R109 Unspecified abdominal pain: Secondary | ICD-10-CM

## 2023-09-22 LAB — URINALYSIS, ROUTINE W REFLEX MICROSCOPIC
Bacteria, UA: NONE SEEN
Bilirubin Urine: NEGATIVE
Glucose, UA: >=500 mg/dL — AB
Hgb urine dipstick: NEGATIVE
Ketones, ur: 5 mg/dL — AB
Leukocytes,Ua: NEGATIVE
Nitrite: NEGATIVE
Protein, ur: NEGATIVE mg/dL
Specific Gravity, Urine: 1.027 (ref 1.005–1.030)
pH: 5 (ref 5.0–8.0)

## 2023-09-22 LAB — COMPREHENSIVE METABOLIC PANEL
ALT: <5 U/L (ref 0–44)
AST: 11 U/L — ABNORMAL LOW (ref 15–41)
Albumin: 3.8 g/dL (ref 3.5–5.0)
Alkaline Phosphatase: 76 U/L (ref 38–126)
Anion gap: 9 (ref 5–15)
BUN: 29 mg/dL — ABNORMAL HIGH (ref 8–23)
CO2: 26 mmol/L (ref 22–32)
Calcium: 8.9 mg/dL (ref 8.9–10.3)
Chloride: 104 mmol/L (ref 98–111)
Creatinine, Ser: 1.33 mg/dL — ABNORMAL HIGH (ref 0.61–1.24)
GFR, Estimated: 54 mL/min — ABNORMAL LOW (ref >60)
Glucose, Bld: 116 mg/dL — ABNORMAL HIGH (ref 70–99)
Potassium: 4.2 mmol/L (ref 3.5–5.1)
Sodium: 139 mmol/L (ref 135–145)
Total Bilirubin: 0.9 mg/dL (ref <1.2)
Total Protein: 7.7 g/dL (ref 6.5–8.1)

## 2023-09-22 LAB — CBC
HCT: 42.2 % (ref 39.0–52.0)
Hemoglobin: 14 g/dL (ref 13.0–17.0)
MCH: 30.9 pg (ref 26.0–34.0)
MCHC: 33.2 g/dL (ref 30.0–36.0)
MCV: 93.2 fL (ref 80.0–100.0)
Platelets: 224 10*3/uL (ref 150–400)
RBC: 4.53 MIL/uL (ref 4.22–5.81)
RDW: 13.7 % (ref 11.5–15.5)
WBC: 11 10*3/uL — ABNORMAL HIGH (ref 4.0–10.5)
nRBC: 0 % (ref 0.0–0.2)

## 2023-09-22 LAB — LIPASE, BLOOD: Lipase: 23 U/L (ref 11–51)

## 2023-09-22 MED ORDER — METOCLOPRAMIDE HCL 5 MG PO TABS: 5.0000 mg | ORAL_TABLET | Freq: Three times a day (TID) | ORAL | 0 refills | Status: DC

## 2023-09-22 MED ORDER — ACETAMINOPHEN 500 MG PO TABS
1000.0000 mg | ORAL_TABLET | Freq: Once | ORAL | Status: AC
  Administered 2023-09-22: 1000 mg via ORAL
  Filled 2023-09-22: qty 2

## 2023-09-22 MED ORDER — POLYETHYLENE GLYCOL 3350 17 G PO PACK: 17.0000 g | PACK | Freq: Every day | ORAL | 0 refills | Status: DC

## 2023-09-22 MED ORDER — KETOROLAC TROMETHAMINE 15 MG/ML IJ SOLN
7.5000 mg | Freq: Once | INTRAMUSCULAR | Status: AC
  Administered 2023-09-22: 7.5 mg via INTRAMUSCULAR
  Filled 2023-09-22: qty 1

## 2023-09-22 NOTE — ED Triage Notes (Signed)
Pt arrives from home via EMS pt sts that he woke up this morning having lower right back pain that went into his abd. Pt denies any N/V/D

## 2023-09-22 NOTE — Discharge Instructions (Addendum)
You are retaining too much urine in your bladder most likely due to your enlarged prostate, which can cause quite a bit of discomfort in your abdomen.  You got up Foley catheter placed to help relieve your bladder of urine.  Call Dr. Lonna Cobb for a follow-up appointment for enlarged prostate and to see when the Foley catheter can be removed.  Your CT scan did not show any emergency conditions that would require surgery.  There did appear to be ileus or slow movement of the contents through your intestines.  Take Reglan which is a medication that can help move things along, as well as MiraLAX can help with constipation.  Thank you for choosing [place] for your health care today!  Please see your primary doctor this week for a follow up appointment.   If you have any new, worsening, or unexpected symptoms call your doctor right away or come back to the emergency department for reevaluation.  It was my pleasure to care for you today.   Carl Dan Modesto Charon, MD

## 2023-09-22 NOTE — ED Provider Notes (Signed)
[facility] Provider Note    Event Date/Time   First MD Initiated Contact with Patient 09/22/23 1142     (approximate)   History   Abdominal Pain   HPI  Carl Mcclain is a 77 y.o. male   Past medical history of Parkinson's, CHF, COPD, prostate cancer, hypertension, here with right-sided flank pain rating to the right lower abdomen/groin and up to the right shoulder.  Started this morning.  No trauma.  No urinary symptoms.  No fevers or chills.  No nausea vomiting or diarrhea.  Last bowel movement was yesterday, more normal.  History of remote hernia repair.  Independent Historian contributed to assessment above: Wife is at bedside to corroborate information past medical history as above       Physical Exam   Triage Vital Signs: ED Triage Vitals  Encounter Vitals Group     BP 09/22/23 1105 114/86     Systolic BP Percentile --      Diastolic BP Percentile --      Pulse Rate 09/22/23 1105 91     Resp 09/22/23 1105 19     Temp 09/22/23 1105 98.4 F (36.9 C)     Temp Source 09/22/23 1105 Oral     SpO2 --      Weight 09/22/23 1106 150 lb (68 kg)     Height 09/22/23 1106 6\' 8"  (2.032 m)     Head Circumference --      Peak Flow --      Pain Score 09/22/23 1105 6     Pain Loc --      Pain Education --      Exclude from Growth Chart --     Most recent vital signs: Vitals:   09/22/23 1105  BP: 114/86  Pulse: 91  Resp: 19  Temp: 98.4 F (36.9 C)    General: Awake, no distress.  CV:  Good peripheral perfusion.  Resp:  Normal effort.  Abd:  No distention.  Other:  Awake alert, mumbling speech is normal per wife.  Right-sided CVA tenderness as well as right sided mild tenderness to palpation without rigidity or guarding.  Normal vital signs afebrile.   ED Results / Procedures / Treatments   Labs (all labs ordered are listed, but only abnormal results are displayed) Labs Reviewed  COMPREHENSIVE METABOLIC PANEL - Abnormal; Notable for the  following components:      Result Value   Glucose, Bld 116 (*)    BUN 29 (*)    Creatinine, Ser 1.33 (*)    AST 11 (*)    GFR, Estimated 54 (*)    All other components within normal limits  CBC - Abnormal; Notable for the following components:   WBC 11.0 (*)    All other components within normal limits  LIPASE, BLOOD  URINALYSIS, ROUTINE W REFLEX MICROSCOPIC     I ordered and reviewed the above labs they are notable for creatinine is 1.33 which is an improvement from prior in the twos.  White blood cell count mildly elevated 11  RADIOLOGY I independently reviewed and interpreted CT of the abdomen pelvis and see no obvious obstructive or inflammatory changes though I do note a significant mount of stool throughout the colon I also reviewed radiologist's formal read.   PROCEDURES:  Critical Care performed: No  Procedures   MEDICATIONS ORDERED IN ED: Medications  ketorolac (TORADOL) 15 MG/ML injection 7.5 mg (has no administration in time range)  acetaminophen (TYLENOL) tablet 1,000  mg (has no administration in time range)     IMPRESSION / MDM / ASSESSMENT AND PLAN / ED COURSE  I reviewed the triage vital signs and the nursing notes.                                Patient's presentation is most consistent with acute presentation with potential threat to life or bodily function.  Differential diagnosis includes, but is not limited to, renal colic, kidney stone, urinary tract infection, appendicitis, cholecystitis, choledocholithiasis, pancreatitis, lumbar strain  The patient is on the cardiac monitor to evaluate for evidence of arrhythmia and/or significant heart rate changes.  MDM:    Chief complaint of right flank pain rating down to the groin is most consistent with renal colic or kidney stone.  Intermittent nature of pain matches this potential diagnosis as well so we will start with a CT scan of the abdomen pelvis without contrast.  Considered biliary pathologies,  appendicitis as well though I think less likely, will observe for any findings suggestive of these pathologies on the CT scan as well.  Labs largely unremarkable, will check urinalysis.   -- Workup reveals urinary retention with nearly half a liter retaining after voiding, no urinary tract infection, CT scan with stool burden and signs of mild ileus, plan will be for Foley catheter placement, urology follow-up.  Has history of enlarged prostate status post prostatectomy, no motor or sensory deficits, no back pain in the midline or trauma so I doubt that his retention is due to emergency cord compression but rather prostate issues.  Plan will be for discharge, urology follow-up, PMD follow-up medications for ileus/constipation return if any worsening.      FINAL CLINICAL IMPRESSION(S) / ED DIAGNOSES   Final diagnoses:  Acute right flank pain     Rx / DC Orders   ED Discharge Orders     None        Note:  This document was prepared using Dragon voice recognition software and may include unintentional dictation errors.    Pilar Jarvis, MD 09/22/23 [phone]

## 2023-09-22 NOTE — ED Notes (Signed)
Bladder scan 431

## 2023-09-22 NOTE — ED Notes (Signed)
Pt's wife educated on foley care and leg bag usage.

## 2023-10-04 ENCOUNTER — Emergency Department: Payer: Medicare Other

## 2023-10-04 ENCOUNTER — Encounter: Payer: Self-pay | Admitting: Emergency Medicine

## 2023-10-04 ENCOUNTER — Inpatient Hospital Stay
Admission: EM | Admit: 2023-10-04 | Discharge: 2023-10-09 | DRG: 871 | Disposition: A | Discharge status: Hospice | Payer: Medicare Other | Attending: Student | Admitting: Student

## 2023-10-04 ENCOUNTER — Other Ambulatory Visit: Payer: Self-pay

## 2023-10-04 DIAGNOSIS — K219 Gastro-esophageal reflux disease without esophagitis: Secondary | ICD-10-CM | POA: Diagnosis present

## 2023-10-04 DIAGNOSIS — Z515 Encounter for palliative care: Secondary | ICD-10-CM | POA: Diagnosis not present

## 2023-10-04 DIAGNOSIS — Z66 Do not resuscitate: Secondary | ICD-10-CM | POA: Diagnosis present

## 2023-10-04 DIAGNOSIS — R6520 Severe sepsis without septic shock: Secondary | ICD-10-CM | POA: Diagnosis present

## 2023-10-04 DIAGNOSIS — J90 Pleural effusion, not elsewhere classified: Secondary | ICD-10-CM | POA: Diagnosis not present

## 2023-10-04 DIAGNOSIS — Z8546 Personal history of malignant neoplasm of prostate: Secondary | ICD-10-CM

## 2023-10-04 DIAGNOSIS — J189 Pneumonia, unspecified organism: Secondary | ICD-10-CM | POA: Diagnosis present

## 2023-10-04 DIAGNOSIS — Z79899 Other long term (current) drug therapy: Secondary | ICD-10-CM | POA: Diagnosis not present

## 2023-10-04 DIAGNOSIS — N1831 Chronic kidney disease, stage 3a: Secondary | ICD-10-CM | POA: Diagnosis present

## 2023-10-04 DIAGNOSIS — D509 Iron deficiency anemia, unspecified: Secondary | ICD-10-CM | POA: Diagnosis present

## 2023-10-04 DIAGNOSIS — E872 Acidosis, unspecified: Secondary | ICD-10-CM | POA: Diagnosis present

## 2023-10-04 DIAGNOSIS — L89322 Pressure ulcer of left buttock, stage 2: Secondary | ICD-10-CM | POA: Diagnosis present

## 2023-10-04 DIAGNOSIS — F419 Anxiety disorder, unspecified: Secondary | ICD-10-CM | POA: Diagnosis present

## 2023-10-04 DIAGNOSIS — E538 Deficiency of other specified B group vitamins: Secondary | ICD-10-CM | POA: Diagnosis present

## 2023-10-04 DIAGNOSIS — D72829 Elevated white blood cell count, unspecified: Secondary | ICD-10-CM

## 2023-10-04 DIAGNOSIS — J440 Chronic obstructive pulmonary disease with acute lower respiratory infection: Secondary | ICD-10-CM | POA: Diagnosis present

## 2023-10-04 DIAGNOSIS — I5022 Chronic systolic (congestive) heart failure: Secondary | ICD-10-CM | POA: Diagnosis present

## 2023-10-04 DIAGNOSIS — J690 Pneumonitis due to inhalation of food and vomit: Secondary | ICD-10-CM | POA: Diagnosis present

## 2023-10-04 DIAGNOSIS — F02C18 Dementia in other diseases classified elsewhere, severe, with other behavioral disturbance: Secondary | ICD-10-CM | POA: Diagnosis present

## 2023-10-04 DIAGNOSIS — Z9889 Other specified postprocedural states: Secondary | ICD-10-CM

## 2023-10-04 DIAGNOSIS — R1310 Dysphagia, unspecified: Secondary | ICD-10-CM | POA: Diagnosis present

## 2023-10-04 DIAGNOSIS — G4733 Obstructive sleep apnea (adult) (pediatric): Secondary | ICD-10-CM | POA: Diagnosis present

## 2023-10-04 DIAGNOSIS — N179 Acute kidney failure, unspecified: Secondary | ICD-10-CM | POA: Diagnosis present

## 2023-10-04 DIAGNOSIS — Z87891 Personal history of nicotine dependence: Secondary | ICD-10-CM | POA: Diagnosis not present

## 2023-10-04 DIAGNOSIS — E860 Dehydration: Secondary | ICD-10-CM | POA: Diagnosis present

## 2023-10-04 DIAGNOSIS — A419 Sepsis, unspecified organism: Secondary | ICD-10-CM | POA: Diagnosis present

## 2023-10-04 DIAGNOSIS — N3001 Acute cystitis with hematuria: Secondary | ICD-10-CM | POA: Diagnosis present

## 2023-10-04 DIAGNOSIS — G20A1 Parkinson's disease without dyskinesia, without mention of fluctuations: Secondary | ICD-10-CM | POA: Diagnosis present

## 2023-10-04 DIAGNOSIS — R7989 Other specified abnormal findings of blood chemistry: Secondary | ICD-10-CM

## 2023-10-04 DIAGNOSIS — I130 Hypertensive heart and chronic kidney disease with heart failure and stage 1 through stage 4 chronic kidney disease, or unspecified chronic kidney disease: Secondary | ICD-10-CM | POA: Diagnosis present

## 2023-10-04 LAB — COMPREHENSIVE METABOLIC PANEL
ALT: <5 U/L (ref 0–44)
AST: 13 U/L — ABNORMAL LOW (ref 15–41)
Albumin: 3 g/dL — ABNORMAL LOW (ref 3.5–5.0)
Alkaline Phosphatase: 56 U/L (ref 38–126)
Anion gap: 14 (ref 5–15)
BUN: 53 mg/dL — ABNORMAL HIGH (ref 8–23)
CO2: 21 mmol/L — ABNORMAL LOW (ref 22–32)
Calcium: 8.3 mg/dL — ABNORMAL LOW (ref 8.9–10.3)
Chloride: 99 mmol/L (ref 98–111)
Creatinine, Ser: 2.99 mg/dL — ABNORMAL HIGH (ref 0.61–1.24)
GFR, Estimated: 21 mL/min — ABNORMAL LOW (ref >60)
Glucose, Bld: 131 mg/dL — ABNORMAL HIGH (ref 70–99)
Potassium: 3.9 mmol/L (ref 3.5–5.1)
Sodium: 134 mmol/L — ABNORMAL LOW (ref 135–145)
Total Bilirubin: 1 mg/dL (ref <1.2)
Total Protein: 6.8 g/dL (ref 6.5–8.1)

## 2023-10-04 LAB — URINALYSIS, ROUTINE W REFLEX MICROSCOPIC
Glucose, UA: 150 mg/dL — AB
Ketones, ur: 5 mg/dL — AB
Nitrite: NEGATIVE
Protein, ur: 100 mg/dL — AB
RBC / HPF: >50 RBC/hpf (ref 0–5)
Specific Gravity, Urine: 1.024 (ref 1.005–1.030)
WBC, UA: >50 WBC/hpf (ref 0–5)
pH: 5 (ref 5.0–8.0)

## 2023-10-04 LAB — CBC WITH DIFFERENTIAL/PLATELET
Abs Immature Granulocytes: 0.78 10*3/uL — ABNORMAL HIGH (ref 0.00–0.07)
Basophils Absolute: 0.1 10*3/uL (ref 0.0–0.1)
Basophils Relative: 0 %
Eosinophils Absolute: 0 10*3/uL (ref 0.0–0.5)
Eosinophils Relative: 0 %
HCT: 40.7 % (ref 39.0–52.0)
Hemoglobin: 13.6 g/dL (ref 13.0–17.0)
Immature Granulocytes: 2 %
Lymphocytes Relative: 2 %
Lymphs Abs: 0.6 10*3/uL — ABNORMAL LOW (ref 0.7–4.0)
MCH: 30.2 pg (ref 26.0–34.0)
MCHC: 33.4 g/dL (ref 30.0–36.0)
MCV: 90.4 fL (ref 80.0–100.0)
Monocytes Absolute: 1.4 10*3/uL — ABNORMAL HIGH (ref 0.1–1.0)
Monocytes Relative: 4 %
Neutro Abs: 29.5 10*3/uL — ABNORMAL HIGH (ref 1.7–7.7)
Neutrophils Relative %: 92 %
Platelets: 410 10*3/uL — ABNORMAL HIGH (ref 150–400)
RBC: 4.5 MIL/uL (ref 4.22–5.81)
RDW: 13.6 % (ref 11.5–15.5)
Smear Review: NORMAL
WBC: 32.4 10*3/uL — ABNORMAL HIGH (ref 4.0–10.5)
nRBC: 0 % (ref 0.0–0.2)

## 2023-10-04 LAB — RESP PANEL BY RT-PCR (RSV, FLU A&B, COVID)  RVPGX2
Influenza A by PCR: NEGATIVE
Influenza B by PCR: NEGATIVE
Resp Syncytial Virus by PCR: NEGATIVE
SARS Coronavirus 2 by RT PCR: NEGATIVE

## 2023-10-04 LAB — TROPONIN I (HIGH SENSITIVITY): Troponin I (High Sensitivity): 17 ng/L (ref <18)

## 2023-10-04 LAB — LIPASE, BLOOD: Lipase: 21 U/L (ref 11–51)

## 2023-10-04 LAB — BRAIN NATRIURETIC PEPTIDE: B Natriuretic Peptide: 417 pg/mL — ABNORMAL HIGH (ref 0.0–100.0)

## 2023-10-04 LAB — MAGNESIUM: Magnesium: 2.3 mg/dL (ref 1.7–2.4)

## 2023-10-04 MED ORDER — ACETAMINOPHEN 650 MG RE SUPP: 650.0000 mg | Freq: Four times a day (QID) | RECTAL | Status: DC | PRN

## 2023-10-04 MED ORDER — HYDROCODONE-ACETAMINOPHEN 5-325 MG PO TABS
1.0000 | ORAL_TABLET | ORAL | Status: DC | PRN
Start: 2023-10-04 — End: 2023-10-08

## 2023-10-04 MED ORDER — CARBIDOPA-LEVODOPA 25-100 MG PO TABS
2.0000 | ORAL_TABLET | Freq: Three times a day (TID) | ORAL | Status: DC
  Administered 2023-10-05 – 2023-10-06 (×4): 2 via ORAL
  Filled 2023-10-04 (×6): qty 2

## 2023-10-04 MED ORDER — ALBUTEROL SULFATE (2.5 MG/3ML) 0.083% IN NEBU: 2.5000 mg | INHALATION_SOLUTION | Freq: Four times a day (QID) | RESPIRATORY_TRACT | Status: DC | PRN

## 2023-10-04 MED ORDER — SODIUM CHLORIDE 0.9 % IV BOLUS
1000.0000 mL | Freq: Once | INTRAVENOUS | Status: AC
  Administered 2023-10-04: 1000 mL via INTRAVENOUS

## 2023-10-04 MED ORDER — SODIUM CHLORIDE 0.9% FLUSH
3.0000 mL | Freq: Two times a day (BID) | INTRAVENOUS | Status: DC
  Administered 2023-10-04 – 2023-10-06 (×4): 3 mL via INTRAVENOUS

## 2023-10-04 MED ORDER — MORPHINE SULFATE (PF) 2 MG/ML IV SOLN
2.0000 mg | INTRAVENOUS | Status: DC | PRN
Start: 2023-10-04 — End: 2023-10-08

## 2023-10-04 MED ORDER — ENALAPRIL MALEATE 2.5 MG PO TABS: 2.5000 mg | ORAL_TABLET | Freq: Every day | ORAL | Status: DC

## 2023-10-04 MED ORDER — SODIUM CHLORIDE 0.9 % IV SOLN
2.0000 g | Freq: Once | INTRAVENOUS | Status: AC
  Administered 2023-10-04: 2 g via INTRAVENOUS
  Filled 2023-10-04: qty 20

## 2023-10-04 MED ORDER — CARBIDOPA-LEVODOPA 25-100 MG PO TABS
1.0000 | ORAL_TABLET | Freq: Every day | ORAL | Status: DC
  Filled 2023-10-04 (×2): qty 1

## 2023-10-04 MED ORDER — SODIUM CHLORIDE 0.9 % IV SOLN
INTRAVENOUS | Status: AC
Start: 2023-10-04 — End: 2023-10-05

## 2023-10-04 MED ORDER — ACETAMINOPHEN 325 MG PO TABS: 650.0000 mg | ORAL_TABLET | Freq: Four times a day (QID) | ORAL | Status: DC | PRN

## 2023-10-04 MED ORDER — SODIUM CHLORIDE 0.9 % IV SOLN
500.0000 mg | Freq: Once | INTRAVENOUS | Status: AC
  Administered 2023-10-04: 500 mg via INTRAVENOUS
  Filled 2023-10-04: qty 5

## 2023-10-04 MED ORDER — CARVEDILOL 6.25 MG PO TABS: 3.1250 mg | ORAL_TABLET | Freq: Two times a day (BID) | ORAL | Status: DC

## 2023-10-04 NOTE — ED Triage Notes (Signed)
Patient to ED via [facility] from home for generalized weakness since Friday. Hx Parkinson's. Wife reports more altered than normal.

## 2023-10-04 NOTE — ED Provider Notes (Signed)
[facility] Provider Note    Event Date/Time   First MD Initiated Contact with Carl Mcclain 10/04/23 1756     (approximate)   History   Weakness   HPI Carl Mcclain is a 77 y.o. 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  Family reported that Carl Mcclain was weaker than normal at home as well as had altered mental status.  Carl Mcclain himself notes that he has had a cough with some productive sputum and associated shortness of breath.  Denied fevers, chills, chest pain, abdominal pain, nausea, vomiting, diarrhea, constipation, dysuria.  Chart review: Reviewed Carl Mcclain's most recent chart notes with neurology in early November for Parkinson's follow-up.       Physical Exam   Triage Vital Signs: ED Triage Vitals  Encounter Vitals Group     BP      Systolic BP Percentile      Diastolic BP Percentile      Pulse      Resp      Temp      Temp src      SpO2      Weight      Height      Head Circumference      Peak Flow      Pain Score      Pain Loc      Pain Education      Exclude from Growth Chart     Most recent vital signs: Vitals:   10/04/23 2140 10/04/23 2143  BP:    Pulse: 96   Resp: 20   Temp:  97.8 F (36.6 C)  SpO2: 91%     Physical Exam: I have reviewed the vital signs and nursing notes. General: Awake, alert, no acute distress.  Nontoxic appearing. Head:  Atraumatic, normocephalic.   ENT:  EOM intact, PERRL. Oral mucosa is pink and moist with no lesions. Neck: Neck is supple with full range of motion, No meningeal signs. Cardiovascular:  RRR, No murmurs. Peripheral pulses palpable and equal bilaterally. Respiratory:  Symmetrical chest wall expansion.  No rhonchi, rales, or wheezes.  Good air movement throughout.  No use of accessory muscles.   Musculoskeletal:  No cyanosis or edema. Moving extremities with full ROM Abdomen:  Soft, nontender, nondistended. Neuro:  GCS 15, moving  all four extremities, interacting appropriately. Speech clear. Psych:  Calm, appropriate.   Skin:  Warm, dry, no rash.    ED Results / Procedures / Treatments   Labs (all labs ordered are listed, but only abnormal results are displayed) Labs Reviewed  CBC WITH DIFFERENTIAL/PLATELET - Abnormal; Notable for the following components:      Result Value   WBC 32.4 (*)    Platelets 410 (*)    Neutro Abs 29.5 (*)    Lymphs Abs 0.6 (*)    Monocytes Absolute 1.4 (*)    Abs Immature Granulocytes 0.78 (*)    All other components within normal limits  COMPREHENSIVE METABOLIC PANEL - Abnormal; Notable for the following components:   Sodium 134 (*)    CO2 21 (*)    Glucose, Bld 131 (*)    BUN 53 (*)    Creatinine, Ser 2.99 (*)    Calcium 8.3 (*)    Albumin 3.0 (*)    AST 13 (*)    GFR, Estimated 21 (*)    All other components within normal limits  URINALYSIS, ROUTINE W REFLEX MICROSCOPIC -  Abnormal; Notable for the following components:   Color, Urine AMBER (*)    APPearance TURBID (*)    Glucose, UA 150 (*)    Hgb urine dipstick MODERATE (*)    Bilirubin Urine SMALL (*)    Ketones, ur 5 (*)    Protein, ur 100 (*)    Leukocytes,Ua MODERATE (*)    Bacteria, UA MANY (*)    All other components within normal limits  BRAIN NATRIURETIC PEPTIDE - Abnormal; Notable for the following components:   B Natriuretic Peptide 417.0 (*)    All other components within normal limits  RESP PANEL BY RT-PCR (RSV, FLU A&B, COVID)  RVPGX2  LIPASE, BLOOD  MAGNESIUM  COMPREHENSIVE METABOLIC PANEL  CBC  TROPONIN I (HIGH SENSITIVITY)     EKG My EKG interpretation: Rate of 97, normal sinus rhythm, normal axis.  No acute ST elevations or depressions   RADIOLOGY Independently interpreted chest x-ray and CT chest with evidence of large right-sided pleural effusion and questionable underlying infiltrate.  Some suggestions of loculated effusion as well   PROCEDURES:  Critical Care performed:  No  Procedures   MEDICATIONS ORDERED IN ED: Medications  sodium chloride flush (NS) 0.9 % injection 3 mL (has no administration in time range)  0.9 %  sodium chloride infusion (has no administration in time range)  acetaminophen (TYLENOL) tablet 650 mg (has no administration in time range)    Or  acetaminophen (TYLENOL) suppository 650 mg (has no administration in time range)  HYDROcodone-acetaminophen (NORCO/VICODIN) 5-325 MG per tablet 1 tablet (has no administration in time range)  morphine (PF) 2 MG/ML injection 2 mg (has no administration in time range)  sodium chloride 0.9 % bolus 1,000 mL (0 mLs Intravenous Stopped 10/04/23 1939)  cefTRIAXone (ROCEPHIN) 2 g in sodium chloride 0.9 % 100 mL IVPB (0 g Intravenous Stopped 10/04/23 2143)  azithromycin (ZITHROMAX) 500 mg in sodium chloride 0.9 % 250 mL IVPB (0 mg Intravenous Stopped 10/04/23 2143)     IMPRESSION / MDM / ASSESSMENT AND PLAN / ED COURSE  I reviewed the triage vital signs and the nursing notes.                              Differential diagnosis includes, but is not limited to, pleural effusion, pneumonia, UTI, sepsis  Carl Mcclain's presentation is most consistent with acute presentation with potential threat to life or bodily function.  Carl Mcclain is a 77 year old male presenting today for acute encephalopathy, weakness.  Vital signs stable on arrival with only slight confusion from his baseline.  Laboratory workup notable for prominent leukocytosis of 32.4K.  CMP also shows worsening AKI on CKD.  Infectious workup with UA and chest x-ray shows both a UTI and large right-sided pleural effusion with possible infiltrates.  Follow-up CT chest without contrast was performed given Carl Mcclain's worsening renal function and shows large right-sided pleural effusion with questionable underlying infiltrates.  Also appears to be potential areas of loculation.  Given altered mental status and new infectious sources with large pleural effusion,  consulted hospitalist for admission.  We discussed potentially whether this could be empyema or not which is difficult to differentiate at this time.  She asked for consultation with IR.  I spoke with Dr. Deanne Coffer on-call who states that IR will get him on the list for tomorrow for thoracentesis and potential pigtail placement if that does not fully remove all the fluid.  Hospitalist is agreed to admit  Carl Mcclain at this time for further care.  Carl Mcclain was initially given ceftriaxone azithromycin in the ED for pneumonia and UTI coverage.  This antibiotic regimen was discussed with the hospitalist in the setting of possible empyema who will reassess for broadening coverage.  The Carl Mcclain is on the cardiac monitor to evaluate for evidence of arrhythmia and/or significant heart rate changes. Clinical Course as of 10/04/23 2257  Wed Oct 04, 2023  1829 WBC(!): 32.4 [DW]  1856 DG Chest Portable 1 View Large right sided hazy infiltrates concerning for pneumonia [DW]  1902 Comprehensive metabolic panel(!) New AKI from 12 days ago [DW]  1939 DG Chest Portable 1 View New moderate right pleural effusion with partial compressive atelectasis of the right lung versus pneumonia. [DW]  1948 Urinalysis, Routine w reflex microscopic -Urine, Clean Catch(!) Positive UTI [DW]    Clinical Course User Index [DW] Janith Lima, MD     FINAL CLINICAL IMPRESSION(S) / ED DIAGNOSES   Final diagnoses:  Acute cystitis with hematuria  Leukocytosis, unspecified type  AKI (acute kidney injury) (HCC)  Pleural effusion, right  Elevated brain natriuretic peptide (BNP) level     Rx / DC Orders   ED Discharge Orders     None        Note:  This document was prepared using Dragon voice recognition software and may include unintentional dictation errors.   Janith Lima, MD 10/04/23 2300

## 2023-10-05 ENCOUNTER — Inpatient Hospital Stay: Payer: Medicare Other

## 2023-10-05 DIAGNOSIS — J90 Pleural effusion, not elsewhere classified: Secondary | ICD-10-CM

## 2023-10-05 LAB — CBC
HCT: 36.1 % — ABNORMAL LOW (ref 39.0–52.0)
Hemoglobin: 12.2 g/dL — ABNORMAL LOW (ref 13.0–17.0)
MCH: 30.7 pg (ref 26.0–34.0)
MCHC: 33.8 g/dL (ref 30.0–36.0)
MCV: 90.9 fL (ref 80.0–100.0)
Platelets: 365 10*3/uL (ref 150–400)
RBC: 3.97 MIL/uL — ABNORMAL LOW (ref 4.22–5.81)
RDW: 13.8 % (ref 11.5–15.5)
WBC: 27.7 10*3/uL — ABNORMAL HIGH (ref 4.0–10.5)
nRBC: 0 % (ref 0.0–0.2)

## 2023-10-05 LAB — IRON AND TIBC
Iron: 14 ug/dL — ABNORMAL LOW (ref 45–182)
Saturation Ratios: 10 % — ABNORMAL LOW (ref 17.9–39.5)
TIBC: 134 ug/dL — ABNORMAL LOW (ref 250–450)
UIBC: 120 ug/dL

## 2023-10-05 LAB — COMPREHENSIVE METABOLIC PANEL
ALT: 6 U/L (ref 0–44)
AST: 9 U/L — ABNORMAL LOW (ref 15–41)
Albumin: 2.7 g/dL — ABNORMAL LOW (ref 3.5–5.0)
Alkaline Phosphatase: 49 U/L (ref 38–126)
Anion gap: 15 (ref 5–15)
BUN: 63 mg/dL — ABNORMAL HIGH (ref 8–23)
CO2: 18 mmol/L — ABNORMAL LOW (ref 22–32)
Calcium: 7.5 mg/dL — ABNORMAL LOW (ref 8.9–10.3)
Chloride: 103 mmol/L (ref 98–111)
Creatinine, Ser: 2.9 mg/dL — ABNORMAL HIGH (ref 0.61–1.24)
GFR, Estimated: 21 mL/min — ABNORMAL LOW (ref >60)
Glucose, Bld: 107 mg/dL — ABNORMAL HIGH (ref 70–99)
Potassium: 3.9 mmol/L (ref 3.5–5.1)
Sodium: 136 mmol/L (ref 135–145)
Total Bilirubin: 0.8 mg/dL (ref <1.2)
Total Protein: 6.1 g/dL — ABNORMAL LOW (ref 6.5–8.1)

## 2023-10-05 LAB — PHOSPHORUS: Phosphorus: 4.9 mg/dL — ABNORMAL HIGH (ref 2.5–4.6)

## 2023-10-05 LAB — LACTATE DEHYDROGENASE: LDH: 88 U/L — ABNORMAL LOW (ref 98–192)

## 2023-10-05 LAB — LACTIC ACID, PLASMA
Lactic Acid, Venous: 1.2 mmol/L (ref 0.5–1.9)
Lactic Acid, Venous: 1 mmol/L (ref 0.5–1.9)

## 2023-10-05 LAB — MAGNESIUM: Magnesium: 2.2 mg/dL (ref 1.7–2.4)

## 2023-10-05 LAB — FOLATE: Folate: 3.9 ng/mL — ABNORMAL LOW (ref >5.9)

## 2023-10-05 MED ORDER — MIRTAZAPINE 15 MG PO TABS
30.0000 mg | ORAL_TABLET | Freq: Every day | ORAL | Status: DC
  Administered 2023-10-05: 30 mg via ORAL
  Filled 2023-10-05 (×3): qty 2

## 2023-10-05 MED ORDER — QUETIAPINE FUMARATE 200 MG PO TABS: 400.0000 mg | ORAL_TABLET | Freq: Every day | ORAL | Status: DC

## 2023-10-05 MED ORDER — ENOXAPARIN SODIUM 40 MG/0.4ML IJ SOSY
40.0000 mg | PREFILLED_SYRINGE | Freq: Every evening | INTRAMUSCULAR | Status: DC
  Administered 2023-10-05 – 2023-10-07 (×3): 40 mg via SUBCUTANEOUS
  Filled 2023-10-05 (×3): qty 0.4

## 2023-10-05 MED ORDER — CARBIDOPA-LEVODOPA ER 25-100 MG PO TBCR: 1.0000 | EXTENDED_RELEASE_TABLET | Freq: Four times a day (QID) | ORAL | Status: DC

## 2023-10-05 MED ORDER — MIDODRINE HCL 5 MG PO TABS
10.0000 mg | ORAL_TABLET | Freq: Three times a day (TID) | ORAL | Status: DC
  Administered 2023-10-05 – 2023-10-06 (×3): 10 mg via ORAL
  Filled 2023-10-05 (×4): qty 2

## 2023-10-05 MED ORDER — GABAPENTIN 800 MG PO TABS: 800.0000 mg | ORAL_TABLET | Freq: Every day | ORAL | Status: DC

## 2023-10-05 MED ORDER — VANCOMYCIN HCL 2000 MG/400ML IV SOLN
2000.0000 mg | Freq: Once | INTRAVENOUS | Status: AC
  Administered 2023-10-05: 2000 mg via INTRAVENOUS
  Filled 2023-10-05: qty 400

## 2023-10-05 MED ORDER — SODIUM CHLORIDE 0.9 % IV SOLN
500.0000 mg | INTRAVENOUS | Status: DC
  Administered 2023-10-05: 500 mg via INTRAVENOUS
  Filled 2023-10-05: qty 5

## 2023-10-05 MED ORDER — VANCOMYCIN VARIABLE DOSE PER UNSTABLE RENAL FUNCTION (PHARMACIST DOSING): Status: DC

## 2023-10-05 MED ORDER — CARBIDOPA-LEVODOPA ER 25-100 MG PO TBCR
1.0000 | EXTENDED_RELEASE_TABLET | Freq: Four times a day (QID) | ORAL | Status: DC
  Administered 2023-10-05 – 2023-10-06 (×3): 1 via ORAL
  Filled 2023-10-05 (×16): qty 1

## 2023-10-05 MED ORDER — SODIUM BICARBONATE 650 MG PO TABS
650.0000 mg | ORAL_TABLET | Freq: Three times a day (TID) | ORAL | Status: DC
  Administered 2023-10-05 (×2): 650 mg via ORAL
  Filled 2023-10-05 (×4): qty 1

## 2023-10-05 MED ORDER — VANCOMYCIN HCL 500 MG/100ML IV SOLN
500.0000 mg | Freq: Once | INTRAVENOUS | Status: AC
  Administered 2023-10-05: 500 mg via INTRAVENOUS
  Filled 2023-10-05: qty 100

## 2023-10-05 MED ORDER — QUETIAPINE FUMARATE 200 MG PO TABS
400.0000 mg | ORAL_TABLET | Freq: Every day | ORAL | Status: DC
Start: 2023-10-05 — End: 2023-10-08
  Administered 2023-10-05 (×2): 400 mg via ORAL
  Filled 2023-10-05 (×4): qty 2

## 2023-10-05 MED ORDER — SODIUM CHLORIDE 0.9 % IV SOLN
2.0000 g | INTRAVENOUS | Status: DC
  Administered 2023-10-05: 2 g via INTRAVENOUS
  Filled 2023-10-05 (×2): qty 20

## 2023-10-05 MED ORDER — MIRTAZAPINE 15 MG PO TABS: 30.0000 mg | ORAL_TABLET | Freq: Every day | ORAL | Status: DC

## 2023-10-05 NOTE — Progress Notes (Signed)
[facility] Progress Note  Patient: Carl Mcclain    WUJ:[id number]  DOA: 10/04/2023     Date of Service: the patient was seen and examined on 10/05/2023  Chief Complaint  Patient presents with   Weakness   Brief hospital course: Carl Mcclain is a 77 y.o. 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  Wife gives history of patient is awake and restless pulling off leads reports that he does get confused at night and she gives him his Seroquel which helps him.  Patient is cooperative and is allowing to examine him.  No reports of fall fevers incontinence .  On initial signout from the emergency room doctor patient met sepsis criteria and had a white count of 32,000 and was concerning for for possible empyema with an chest a CT was requested.    Initial EKG shows sinus rhythm at 97 with no ST-T wave changes Initial chest x-ray showed right sided pleural effusion with loculation and therefore CT was requested. ED doctor discussed with IR radiology on call I spoke with Dr. Deanne Coffer on-call who states that IR will get him on the list for tomorrow for thoracentesis and potential pigtail placement if that does not fully remove all the fluid.   Admission requested for UTI with hematuria, leukocytosis, acute kidney injury, loculated right-sided pleural effusion, and sepsis.  Assessment and Plan: >> Generalized weakness: - Secondary to infection, dehydration.  Encourage p.o. intake continue with supportive care as needed Tylenol fall precautions and IV hydration along with broad-spectrum IV antibiotic treatment.   >> Sepsis and urinary tract infection: - Urinalysis consistent with urinary tract infection with hematuria more than 50 WBCs nitrite negative leukocyte positive with  turbid urine. -Continue Rocephin follow culture sensitivity. -Normal saline at 30 cc/h for over  24 hours.   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  Avoid contrast studies aand renally dose medications.nd renally dose meds.    >> Right-sided pleural effusion: - Patient found to have a loculated pleural effusion n.p.o. midnight, IR consult consider pulmonary consult - report as follows:  There is a large right pleural effusion identified. There are some rounded areas more superiorly which may have a loculated component. The effusion has significantly increased from the abdomen and pelvis CT of 09/22/2023. Loculated component anterolateral along the right hemithorax on series 2, image 54 measures 4.4 by 2.0 cm. Adjacent parenchymal opacities identified. Atelectasis versus infiltrate. This includes in the middle lobe with some air bronchograms. No right-sided pneumothorax.  Highly suspicious for developing empyema.  12/5 IR consulted for thoracentesis but patient was noncooperative and refused. Advised to try tomorrow a.m., patient may change his mind.   >>Parkinsons: Cont home regimen of sinemet.    >>Dementia with behavioral issue: Cont Seroquel.   Body mass index is 32.55 kg/m.  Interventions:  Diet: Heart healthy diet, fluid striction 1.5 L/day DVT Prophylaxis: Subcutaneous Lovenox   Advance goals of care discussion: Full code  Family Communication: family was not present at bedside, at the time of interview.  The pt provided permission to discuss medical plan with the family. Opportunity was given to ask question and all questions were answered satisfactorily.   Disposition:  Pt is from Home, admitted with sepsis, pleaural effusion, still on IV Abx, which precludes a  safe discharge. Discharge to Home vs SNF TBD, when stable.  Subjective: No significant events overnight, he was seen in the ED, lying comfortably, patient refused answering questions, was  not interested in medical care and as per RN he was refusing medications as well.  Physical Exam: General: NAD, lying comfortably Appear in no distress, affect appropriate Eyes: PERRLA ENT: Oral Mucosa Clear, moist  Neck: no JVD,  Cardiovascular: S1 and S2 Present, no Murmur,  Respiratory: Equal air entry bilaterally, decreased breath sounds right lower chest most likely due to effusion.  No wheezing. Abdomen: Bowel Sound present, Soft and no tenderness,  Skin: no rashes Extremities: no Pedal edema, no calf tenderness Neurologic: without any new focal findings Gait not checked due to patient safety concerns  Vitals:   10/05/23 0936 10/05/23 1015 10/05/23 1140 10/05/23 1315  BP: (!) 102/59 (!) 115/57  116/73  Pulse: (!) 106 (!) 103  93  Resp: 17 13  12   Temp:   97.7 F (36.5 C)   TempSrc:   Oral   SpO2: 97% 98%  99%  Weight:      Height:        Intake/Output Summary (Last 24 hours) at 10/05/2023 1545 Last data filed at 10/05/2023 1402 Gross per 24 hour  Intake 1849.17 ml  Output 425 ml  Net 1424.17 ml   Filed Weights   10/04/23 1758  Weight: 108.9 kg    Data Reviewed: I have personally reviewed and interpreted daily labs, tele strips, imagings as discussed above. I reviewed all nursing notes, pharmacy notes, vitals, pertinent old records I have discussed plan of care as described above with RN and patient/family.  CBC: Recent Labs  Lab 10/04/23 1803 10/05/23 0402  WBC 32.4* 27.7*  NEUTROABS 29.5*  --   HGB 13.6 12.2*  HCT 40.7 36.1*  MCV 90.4 90.9  PLT 410* 365   Basic Metabolic Panel: Recent Labs  Lab 10/04/23 1803 10/05/23 0402  NA 134* 136  K 3.9 3.9  CL 99 103  CO2 21* 18*  GLUCOSE 131* 107*  BUN 53* 63*  CREATININE 2.99* 2.90*  CALCIUM 8.3* 7.5*  MG 2.3 2.2  PHOS  --  4.9*    Studies: CT Chest Wo Contrast  Result Date: 10/04/2023 CLINICAL DATA:  Altered mental status. Weakness and leukocytosis. New right-sided effusion and infiltrate.  EXAM: CT CHEST WITHOUT CONTRAST TECHNIQUE: Multidetector CT imaging of the chest was performed following the standard protocol without IV contrast. RADIATION DOSE REDUCTION: This exam was performed according to the departmental dose-optimization program which includes automated exposure control, adjustment of the mA and/or kV according to patient size and/or use of iterative reconstruction technique. COMPARISON:  Abdomen and pelvis CT 09/22/2023. CTA chest 02/23/2022. Chest x-ray 10/04/2023 and older FINDINGS: Cardiovascular: On this non IV contrast exam, the heart is slightly enlarged. Coronary artery calcifications are seen. Small pericardial effusion. Pulsation artifact identified. The thoracic aorta grossly on this non IV contrast exam has a normal course and caliber with scattered vascular calcifications. Mediastinum/Nodes: Small thyroid gland. Slightly patulous thoracic esophagus. On this non IV contrast exam there is no specific abnormal lymph node enlargement present in the axillary regions, hilum. There are some small mediastinal nodes identified. Less than a cm in short axis and not pathologic by size criteria. Few small right internal mammary chain nodes are also suggested. Lungs/Pleura: Significant breathing motion throughout the examination. Left lung is grossly clear without consolidation, pneumothorax or effusion. Subtle lower lobe bronchiectasis There is a large  right pleural effusion identified. There are some rounded areas more superiorly which may have a loculated component. The effusion has significantly increased from the abdomen and pelvis CT of 09/22/2023. Loculated component anterolateral along the right hemithorax on series 2, image 54 measures 4.4 by 2.0 cm. Adjacent parenchymal opacities identified. Atelectasis versus infiltrate. This includes in the middle lobe with some air bronchograms. No right-sided pneumothorax. Upper Abdomen: Obscured by motion Musculoskeletal: Curvature and  degenerative changes along the spine. There is a presumed perineural cysts or dilated nerve root sheath with bony remodeling involving the left neural foramen at T2-3, unchanged from previous. IMPRESSION: Limited study by motion. Progressive large complex appearing right-sided pleural effusion with the adjacent lung opacities, atelectasis versus infiltrate. Some loculated areas are suggested. Please correlate for clinical evidence a simple versus exudative effusion. Amongst others. Contrast CT may be of some benefit to better define the parenchymal and pleural abnormality and differential would include infectious or neoplastic process assess for pleural thickening or pleural nodules. The effusion may also be amenable to sampling. Enlarged heart.  Coronary artery calcifications. Aortic Atherosclerosis (ICD10-I70.0). Electronically Signed   By: Karen Kays M.D.   On: 10/04/2023 21:36   DG Chest Portable 1 View  Result Date: 10/04/2023 CLINICAL DATA:  Shortness of breath.  Productive cough. EXAM: PORTABLE CHEST 1 VIEW COMPARISON:  Chest radiograph dated 02/23/2022 and CT dated 02/24/2019 FINDINGS: Moderate right pleural effusion, new since the prior study. Right lung base consolidation as well as diffuse hazy density throughout the right lung may represent atelectasis or infiltrate. The left lung is clear. No pneumothorax. Mild cardiomegaly. No acute osseous pathology. IMPRESSION: New moderate right pleural effusion with partial compressive atelectasis of the right lung versus pneumonia. Underlying mass is not excluded. Clinical correlation and follow-up recommended. Electronically Signed   By: Elgie Collard M.D.   On: 10/04/2023 19:35    Scheduled Meds:  carbidopa-levodopa  1 tablet Oral q1800   carbidopa-levodopa  2 tablet Oral TID   Carbidopa-Levodopa ER  1 tablet Oral QID   midodrine  10 mg Oral TID WC   mirtazapine  30 mg Oral QHS   QUEtiapine  400 mg Oral QHS   sodium bicarbonate  650 mg Oral TID    sodium chloride flush  3 mL Intravenous Q12H   vancomycin variable dose per unstable renal function (pharmacist dosing)   Does not apply See admin instructions   Continuous Infusions:  sodium chloride 50 mL/hr (10/05/23 1005)   azithromycin (ZITHROMAX) 500 mg in sodium chloride 0.9 % 250 mL IVPB     cefTRIAXone (ROCEPHIN)  IV     PRN Meds: acetaminophen **OR** acetaminophen, albuterol, HYDROcodone-acetaminophen, morphine injection  Time spent: 35 minutes  Author: Gillis Santa. MD [facility] 10/05/2023 3:45 PM  To reach On-call, see care teams to locate the attending and reach out to them via [email or web address]. If 7PM-7AM, please contact night-coverage If you still have difficulty reaching the attending provider, please page the [facility] (Director on Call) for [facility] on amion for assistance.

## 2023-10-05 NOTE — H&P (Signed)
History and Physical    Patient: Carl Mcclain UXL:[id number] DOB: [birth year] DOA: 10/04/2023 DOS: the patient was seen and examined on 10/05/2023 PCP: Mick Sell, MD  Patient coming from: Home  Chief Complaint:  Chief Complaint  Patient presents with   Weakness    HPI: Carl Mcclain is a 77 y.o. 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  Wife gives history of patient is awake and restless pulling off leads reports that he does get confused at night and she gives him his Seroquel which helps him.  Patient is cooperative and is allowing to examine him.  No reports of fall fevers incontinence .  On initial signout from the emergency room doctor patient met sepsis criteria and had a white count of 32,000 and was concerning for for possible empyema with an chest a CT was requested.  In emergency room vitals trend shows: Vitals:   10/04/23 2140 10/04/23 2143 10/05/23 0115 10/05/23 0150  BP:   118/63   Pulse: 96     Temp:  97.8 F (36.6 C)  97.8 F (36.6 C)  Resp: 20  18   Height:      Weight:      SpO2: 91%     TempSrc:  Axillary  Oral  BMI (Calculated):      Initial EKG shows sinus rhythm at 97 with no ST-T wave changes Initial chest x-ray showed right sided pleural effusion with loculation and therefore CT was requested. ED doctor discussed with IR radiology on call I spoke with Dr. Deanne Coffer on-call who states that IR will get him on the list for tomorrow for thoracentesis and potential pigtail placement if that does not fully remove all the fluid.  Labs are notable for : -  In the ED pt received: Medications  sodium chloride flush (NS) 0.9 % injection 3 mL (3 mLs Intravenous Given 10/04/23 2334)  0.9 %  sodium chloride infusion ( Intravenous New Bag/Given 10/04/23 2333)  acetaminophen (TYLENOL) tablet 650 mg (has no administration in time range)     Or  acetaminophen (TYLENOL) suppository 650 mg (has no administration in time range)  HYDROcodone-acetaminophen (NORCO/VICODIN) 5-325 MG per tablet 1 tablet (has no administration in time range)  morphine (PF) 2 MG/ML injection 2 mg (has no administration in time range)  albuterol (PROVENTIL) (2.5 MG/3ML) 0.083% nebulizer solution 2.5 mg (has no administration in time range)  carbidopa-levodopa (SINEMET IR) 25-100 MG per tablet immediate release 2 tablet (has no administration in time range)  carbidopa-levodopa (SINEMET IR) 25-100 MG per tablet immediate release 1 tablet (has no administration in time range)  Carbidopa-Levodopa ER (SINEMET CR) 25-100 MG tablet controlled release 1 tablet (1 tablet Oral Patient Refused/Not Given 10/05/23 0131)  QUEtiapine (SEROQUEL) tablet 400 mg (400 mg Oral Given 10/05/23 0132)  mirtazapine (REMERON) tablet 30 mg (30 mg Oral Patient Refused/Not Given 10/05/23 0132)  cefTRIAXone (ROCEPHIN) 2 g in sodium chloride 0.9 % 100 mL IVPB (has no administration in time range)  azithromycin (ZITHROMAX) 500 mg in sodium chloride 0.9 % 250 mL IVPB (has no administration in time range)  vancomycin (VANCOREADY) IVPB 2000 mg/400 mL (has no administration in time range)    Followed by  vancomycin (VANCOREADY) IVPB 500 mg/100 mL (has no administration in time range)  vancomycin variable dose per unstable renal function (pharmacist dosing) (has no administration in time range)  sodium chloride 0.9 %  bolus 1,000 mL (0 mLs Intravenous Stopped 10/04/23 1939)  cefTRIAXone (ROCEPHIN) 2 g in sodium chloride 0.9 % 100 mL IVPB (0 g Intravenous Stopped 10/04/23 2143)  azithromycin (ZITHROMAX) 500 mg in sodium chloride 0.9 % 250 mL IVPB (0 mg Intravenous Stopped 10/04/23 2143)     ROS Past Medical History:  Diagnosis Date   Anxiety    Asthma    Cancer (HCC)    prostate   CHF (congestive heart failure) (HCC)    COPD (chronic obstructive pulmonary disease) (HCC)    Dyspnea    GERD  (gastroesophageal reflux disease)    Hypertension    OSA (obstructive sleep apnea)    Parkinson disease (HCC)    Pneumonia    Past Surgical History:  Procedure Laterality Date   INGUINAL HERNIA REPAIR Right 11/18/2021   Procedure: HERNIA REPAIR INGUINAL ADULT;  Surgeon: Sung Amabile, DO;  Location: [facility] ORS;  Service: General;  Laterality: Right;   INSERTION OF MESH  11/18/2021   Procedure: INSERTION OF MESH;  Surgeon: Sung Amabile, DO;  Location: [facility] ORS;  Service: General;;   LEFT HEART CATH AND CORONARY ANGIOGRAPHY N/A 04/18/2022   Procedure: LEFT HEART CATH AND CORONARY ANGIOGRAPHY;  Surgeon: Lamar Blinks, MD;  Location: [facility] IN[facility];  Service: Cardiovascular;  Laterality: N/A;   prostatectomy     Vastectomy     VEIN LIGATION AND STRIPPING      reports that he quit smoking about 44 years ago. His smoking use included cigarettes. He started smoking about 64 years ago. He has a 20 pack-year smoking history. He has never used smokeless tobacco. He reports that he does not drink alcohol and does not use drugs.  No Known Allergies  History reviewed. No pertinent family history.  Prior to Admission medications   Medication Sig Start Date End Date Taking? Authorizing Provider  albuterol (VENTOLIN HFA) 108 (90 Base) MCG/ACT inhaler Inhale 2 puffs into the lungs every 6 (six) hours as needed for wheezing or shortness of breath.   Yes [provider]  carbidopa-levodopa (SINEMET IR) 25-100 MG tablet Take 1-2 tablets by mouth See admin instructions. Take 2 tablets at 0600, take 2 tablets at 1000, 2 tablets at 1400   Yes [provider]  Carbidopa-Levodopa ER (SINEMET CR) 25-100 MG tablet controlled release Take 1 tablet by mouth 4 (four) times daily. 09/16/21  Yes [provider]  dapagliflozin propanediol (FARXIGA) 10 MG TABS tablet Take 10 mg by mouth daily.   Yes [provider]  furosemide (LASIX) 20 MG tablet Take 1 tablet (20 mg total) by  mouth daily. 02/26/22 10/05/23 Yes Darlin Priestly, MD  mirtazapine (REMERON) 30 MG tablet Take 30 mg by mouth at bedtime.   Yes [provider]  polyethylene glycol (MIRALAX) 17 g packet Take 17 g by mouth daily. 09/22/23  Yes Pilar Jarvis, MD  QUEtiapine (SEROQUEL) 400 MG tablet Take 400 mg by mouth at bedtime.   Yes [provider]  gabapentin (NEURONTIN) 800 MG tablet Take 800 mg by mouth at bedtime. Patient not taking: Reported on 10/05/2023 09/04/23   [provider]  metoCLOPramide (REGLAN) 5 MG tablet Take 1 tablet (5 mg total) by mouth 3 (three) times daily for 14 days. Patient not taking: Reported on 10/05/2023 09/22/23 10/06/23  Pilar Jarvis, MD     Vitals:   10/04/23 2140 10/04/23 2143 10/05/23 0115 10/05/23 0150  BP:   118/63   Pulse: 96     Resp: 20  18   Temp:  97.8 F (36.6 C)  97.8 F (36.6 C)  TempSrc:  Axillary  Oral  SpO2: 91%     Weight:      Height:       Physical Exam   Labs on Admission: I have personally reviewed following labs and imaging studies Results for orders placed or performed during the hospital encounter of 10/04/23 (from the past 24 hour(s))  CBC with Differential     Status: Abnormal   Collection Time: 10/04/23  6:03 PM  Result Value Ref Range   WBC 32.4 (H) 4.0 - 10.5 K/uL   RBC 4.50 4.22 - 5.81 MIL/uL   Hemoglobin 13.6 13.0 - 17.0 g/dL   HCT 76.2 83.1 - 51.7 %   MCV 90.4 80.0 - 100.0 fL   MCH 30.2 26.0 - 34.0 pg   MCHC 33.4 30.0 - 36.0 g/dL   RDW 61.6 07.3 - 71.0 %   Platelets 410 (H) 150 - 400 K/uL   nRBC 0.0 0.0 - 0.2 %   Neutrophils Relative % 92 %   Neutro Abs 29.5 (H) 1.7 - 7.7 K/uL   Lymphocytes Relative 2 %   Lymphs Abs 0.6 (L) 0.7 - 4.0 K/uL   Monocytes Relative 4 %   Monocytes Absolute 1.4 (H) 0.1 - 1.0 K/uL   Eosinophils Relative 0 %   Eosinophils Absolute 0.0 0.0 - 0.5 K/uL   Basophils Relative 0 %   Basophils Absolute 0.1 0.0 - 0.1 K/uL   WBC Morphology MORPHOLOGY UNREMARKABLE    RBC Morphology MIXED  RBC POPULATION    Smear Review Normal platelet morphology    Immature Granulocytes 2 %   Abs Immature Granulocytes 0.78 (H) 0.00 - 0.07 K/uL  Comprehensive metabolic panel     Status: Abnormal   Collection Time: 10/04/23  6:03 PM  Result Value Ref Range   Sodium 134 (L) 135 - 145 mmol/L   Potassium 3.9 3.5 - 5.1 mmol/L   Chloride 99 98 - 111 mmol/L   CO2 21 (L) 22 - 32 mmol/L   Glucose, Bld 131 (H) 70 - 99 mg/dL   BUN 53 (H) 8 - 23 mg/dL   Creatinine, Ser 6.26 (H) 0.61 - 1.24 mg/dL   Calcium 8.3 (L) 8.9 - 10.3 mg/dL   Total Protein 6.8 6.5 - 8.1 g/dL   Albumin 3.0 (L) 3.5 - 5.0 g/dL   AST 13 (L) 15 - 41 U/L   ALT <5 0 - 44 U/L   Alkaline Phosphatase 56 38 - 126 U/L   Total Bilirubin 1.0 <1.2 mg/dL   GFR, Estimated 21 (L) >60 mL/min   Anion gap 14 5 - 15  Lipase, blood     Status: None   Collection Time: 10/04/23  6:03 PM  Result Value Ref Range   Lipase 21 11 - 51 U/L  Resp panel by RT-PCR (RSV, Flu A&B, Covid) Anterior Nasal Swab     Status: None   Collection Time: 10/04/23  6:03 PM   Specimen: Anterior Nasal Swab  Result Value Ref Range   SARS Coronavirus 2 by RT PCR NEGATIVE NEGATIVE   Influenza A by PCR NEGATIVE NEGATIVE   Influenza B by PCR NEGATIVE NEGATIVE   Resp Syncytial Virus by PCR NEGATIVE NEGATIVE  Magnesium     Status: None   Collection Time: 10/04/23  6:03 PM  Result Value Ref Range   Magnesium 2.3 1.7 - 2.4 mg/dL  Troponin I (High Sensitivity)  Status: None   Collection Time: 10/04/23  6:03 PM  Result Value Ref Range   Troponin I (High Sensitivity) 17 <18 ng/L  Brain natriuretic peptide     Status: Abnormal   Collection Time: 10/04/23  6:03 PM  Result Value Ref Range   B Natriuretic Peptide 417.0 (H) 0.0 - 100.0 pg/mL  Urinalysis, Routine w reflex microscopic -Urine, Clean Catch     Status: Abnormal   Collection Time: 10/04/23  7:13 PM  Result Value Ref Range   Color, Urine AMBER (A) YELLOW   APPearance TURBID (A) CLEAR   Specific Gravity, Urine  1.024 1.005 - 1.030   pH 5.0 5.0 - 8.0   Glucose, UA 150 (A) NEGATIVE mg/dL   Hgb urine dipstick MODERATE (A) NEGATIVE   Bilirubin Urine SMALL (A) NEGATIVE   Ketones, ur 5 (A) NEGATIVE mg/dL   Protein, ur 409 (A) NEGATIVE mg/dL   Nitrite NEGATIVE NEGATIVE   Leukocytes,Ua MODERATE (A) NEGATIVE   RBC / HPF >50 0 - 5 RBC/hpf   WBC, UA >50 0 - 5 WBC/hpf   Bacteria, UA MANY (A) NONE SEEN   Squamous Epithelial / HPF 0-5 0 - 5 /HPF   Mucus PRESENT     CBC: Recent Labs  Lab 10/04/23 1803  WBC 32.4*  NEUTROABS 29.5*  HGB 13.6  HCT 40.7  MCV 90.4  PLT 410*   Basic Metabolic Panel: Recent Labs  Lab 10/04/23 1803  NA 134*  K 3.9  CL 99  CO2 21*  GLUCOSE 131*  BUN 53*  CREATININE 2.99*  CALCIUM 8.3*  MG 2.3   GFR: Estimated Creatinine Clearance: 25.5 mL/min (A) (by C-G formula based on SCr of 2.99 mg/dL (H)). Liver Function Tests: Recent Labs  Lab 10/04/23 1803  AST 13*  ALT <5  ALKPHOS 56  BILITOT 1.0  PROT 6.8  ALBUMIN 3.0*   Recent Labs  Lab 10/04/23 1803  LIPASE 21   No results for input(s): "AMMONIA" in the last 168 hours. Coagulation Profile: No results for input(s): "INR", "PROTIME" in the last 168 hours. Cardiac Enzymes: No results for input(s): "CKTOTAL", "CKMB", "CKMBINDEX", "TROPONINI" in the last 168 hours. BNP (last 3 results) No results for input(s): "PROBNP" in the last 8760 hours. HbA1C: No results for input(s): "HGBA1C" in the last 72 hours. CBG: No results for input(s): "GLUCAP" in the last 168 hours. Lipid Profile: No results for input(s): "CHOL", "HDL", "LDLCALC", "TRIG", "CHOLHDL", "LDLDIRECT" in the last 72 hours. Thyroid Function Tests: No results for input(s): "TSH", "T4TOTAL", "FREET4", "T3FREE", "THYROIDAB" in the last 72 hours. Anemia Panel: No results for input(s): "VITAMINB12", "FOLATE", "FERRITIN", "TIBC", "IRON", "RETICCTPCT" in the last 72 hours. Urinalysis    Component Value Date/Time   COLORURINE AMBER (A) 10/04/2023  1913   APPEARANCEUR TURBID (A) 10/04/2023 1913   LABSPEC 1.024 10/04/2023 1913   PHURINE 5.0 10/04/2023 1913   GLUCOSEU 150 (A) 10/04/2023 1913   HGBUR MODERATE (A) 10/04/2023 1913   BILIRUBINUR SMALL (A) 10/04/2023 1913   KETONESUR 5 (A) 10/04/2023 1913   PROTEINUR 100 (A) 10/04/2023 1913   NITRITE NEGATIVE 10/04/2023 1913   LEUKOCYTESUR MODERATE (A) 10/04/2023 1913    Unresulted Labs (From admission, onward)     Start     Ordered   10/05/23 0500  Comprehensive metabolic panel  Tomorrow morning,   R        10/04/23 2245   10/05/23 0500  CBC  Tomorrow morning,   R  10/04/23 2245            Medications  sodium chloride flush (NS) 0.9 % injection 3 mL (3 mLs Intravenous Given 10/04/23 2334)  0.9 %  sodium chloride infusion ( Intravenous New Bag/Given 10/04/23 2333)  acetaminophen (TYLENOL) tablet 650 mg (has no administration in time range)    Or  acetaminophen (TYLENOL) suppository 650 mg (has no administration in time range)  HYDROcodone-acetaminophen (NORCO/VICODIN) 5-325 MG per tablet 1 tablet (has no administration in time range)  morphine (PF) 2 MG/ML injection 2 mg (has no administration in time range)  albuterol (PROVENTIL) (2.5 MG/3ML) 0.083% nebulizer solution 2.5 mg (has no administration in time range)  carbidopa-levodopa (SINEMET IR) 25-100 MG per tablet immediate release 2 tablet (has no administration in time range)  carbidopa-levodopa (SINEMET IR) 25-100 MG per tablet immediate release 1 tablet (has no administration in time range)  Carbidopa-Levodopa ER (SINEMET CR) 25-100 MG tablet controlled release 1 tablet (1 tablet Oral Patient Refused/Not Given 10/05/23 0131)  QUEtiapine (SEROQUEL) tablet 400 mg (400 mg Oral Given 10/05/23 0132)  mirtazapine (REMERON) tablet 30 mg (30 mg Oral Patient Refused/Not Given 10/05/23 0132)  cefTRIAXone (ROCEPHIN) 2 g in sodium chloride 0.9 % 100 mL IVPB (has no administration in time range)  azithromycin (ZITHROMAX) 500 mg in  sodium chloride 0.9 % 250 mL IVPB (has no administration in time range)  vancomycin (VANCOREADY) IVPB 2000 mg/400 mL (has no administration in time range)    Followed by  vancomycin (VANCOREADY) IVPB 500 mg/100 mL (has no administration in time range)  vancomycin variable dose per unstable renal function (pharmacist dosing) (has no administration in time range)  sodium chloride 0.9 % bolus 1,000 mL (0 mLs Intravenous Stopped 10/04/23 1939)  cefTRIAXone (ROCEPHIN) 2 g in sodium chloride 0.9 % 100 mL IVPB (0 g Intravenous Stopped 10/04/23 2143)  azithromycin (ZITHROMAX) 500 mg in sodium chloride 0.9 % 250 mL IVPB (0 mg Intravenous Stopped 10/04/23 2143)    Radiological Exams on Admission: CT Chest Wo Contrast  Result Date: 10/04/2023 CLINICAL DATA:  Altered mental status. Weakness and leukocytosis. New right-sided effusion and infiltrate. EXAM: CT CHEST WITHOUT CONTRAST TECHNIQUE: Multidetector CT imaging of the chest was performed following the standard protocol without IV contrast. RADIATION DOSE REDUCTION: This exam was performed according to the departmental dose-optimization program which includes automated exposure control, adjustment of the mA and/or kV according to patient size and/or use of iterative reconstruction technique. COMPARISON:  Abdomen and pelvis CT 09/22/2023. CTA chest 02/23/2022. Chest x-ray 10/04/2023 and older FINDINGS: Cardiovascular: On this non IV contrast exam, the heart is slightly enlarged. Coronary artery calcifications are seen. Small pericardial effusion. Pulsation artifact identified. The thoracic aorta grossly on this non IV contrast exam has a normal course and caliber with scattered vascular calcifications. Mediastinum/Nodes: Small thyroid gland. Slightly patulous thoracic esophagus. On this non IV contrast exam there is no specific abnormal lymph node enlargement present in the axillary regions, hilum. There are some small mediastinal nodes identified. Less than a cm in  short axis and not pathologic by size criteria. Few small right internal mammary chain nodes are also suggested. Lungs/Pleura: Significant breathing motion throughout the examination. Left lung is grossly clear without consolidation, pneumothorax or effusion. Subtle lower lobe bronchiectasis There is a large right pleural effusion identified. There are some rounded areas more superiorly which may have a loculated component. The effusion has significantly increased from the abdomen and pelvis CT of 09/22/2023. Loculated component anterolateral along the right hemithorax  on series 2, image 54 measures 4.4 by 2.0 cm. Adjacent parenchymal opacities identified. Atelectasis versus infiltrate. This includes in the middle lobe with some air bronchograms. No right-sided pneumothorax. Upper Abdomen: Obscured by motion Musculoskeletal: Curvature and degenerative changes along the spine. There is a presumed perineural cysts or dilated nerve root sheath with bony remodeling involving the left neural foramen at T2-3, unchanged from previous. IMPRESSION: Limited study by motion. Progressive large complex appearing right-sided pleural effusion with the adjacent lung opacities, atelectasis versus infiltrate. Some loculated areas are suggested. Please correlate for clinical evidence a simple versus exudative effusion. Amongst others. Contrast CT may be of some benefit to better define the parenchymal and pleural abnormality and differential would include infectious or neoplastic process assess for pleural thickening or pleural nodules. The effusion may also be amenable to sampling. Enlarged heart.  Coronary artery calcifications. Aortic Atherosclerosis (ICD10-I70.0). Electronically Signed   By: Karen Kays M.D.   On: 10/04/2023 21:36   DG Chest Portable 1 View  Result Date: 10/04/2023 CLINICAL DATA:  Shortness of breath.  Productive cough. EXAM: PORTABLE CHEST 1 VIEW COMPARISON:  Chest radiograph dated 02/23/2022 and CT dated  02/24/2019 FINDINGS: Moderate right pleural effusion, new since the prior study. Right lung base consolidation as well as diffuse hazy density throughout the right lung may represent atelectasis or infiltrate. The left lung is clear. No pneumothorax. Mild cardiomegaly. No acute osseous pathology. IMPRESSION: New moderate right pleural effusion with partial compressive atelectasis of the right lung versus pneumonia. Underlying mass is not excluded. Clinical correlation and follow-up recommended. Electronically Signed   By: Elgie Collard M.D.   On: 10/04/2023 19:35     Data Reviewed: Relevant notes from primary care and specialist visits, past discharge summaries as available in EHR, including Care Everywhere. Prior diagnostic testing as pertinent to current admission diagnoses Updated medications and problem lists for reconciliation ED course, including vitals, labs, imaging, treatment and response to treatment Triage notes, nursing and pharmacy notes and ED provider's notes Notable results as noted in HPI  Assessment and Plan: Admission requested for UTI with hematuria, leukocytosis, acute kidney injury, loculated right-sided pleural effusion, and sepsis. >> Generalized weakness: - Secondary to infection, dehydration.  Encourage p.o. intake continue with supportive care as needed Tylenol fall precautions and IV hydration along with broad-spectrum IV antibiotic treatment.  >> Sepsis and urinary tract infection: - Urinalysis consistent with urinary tract infection with hematuria more than 50 WBCs nitrite negative leukocyte positive with  turbid urine. -Continue Rocephin follow culture sensitivity. -Normal saline at 30 cc/h for over 24 hours.  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  Avoid contrast studies aand renally dose medications.nd renally dose meds.   >> Right-sided  pleural effusion: - Patient found to have a loculated pleural effusion n.p.o. midnight, IR consult consider pulmonary consult - report as follows:  There is a large right pleural effusion identified. There are some rounded areas more superiorly which may have a loculated component. The effusion has significantly increased from the abdomen and pelvis CT of 09/22/2023. Loculated component anterolateral along the right hemithorax on series 2, image 54 measures 4.4 by 2.0 cm. Adjacent parenchymal opacities identified. Atelectasis versus infiltrate. This includes in the middle lobe with some air bronchograms. No right-sided pneumothorax.  Highly suspicious for developing empyema.   >>Parkinsons: Cont home regimen of sinemet.   >>Dementia with behavioral issue:  Cont Seroquel.    DVT prophylaxis:  SCD  Consults:  IR  Advance Care Planning:    Code Status: Full Code   Family Communication:  Wife   Disposition Plan:  Home   Severity of Illness: The appropriate patient status for this patient is INPATIENT. Inpatient status is judged to be reasonable and necessary in order to provide the required intensity of service to ensure the patient's safety. The patient's presenting symptoms, physical exam findings, and initial radiographic and laboratory data in the context of their chronic comorbidities is felt to place them at high risk for further clinical deterioration. Furthermore, it is not anticipated that the patient will be medically stable for discharge from the hospital within 2 midnights of admission.   * I certify that at the point of admission it is my clinical judgment that the patient will require inpatient hospital care spanning beyond 2 midnights from the point of admission due to high intensity of service, high risk for further deterioration and high frequency of surveillance required.*  Author: Gertha Calkin, MD 10/05/2023 3:13 AM  For on call review [email or web address].

## 2023-10-05 NOTE — Progress Notes (Signed)
Pharmacy Antibiotic Note  Carl Mcclain is a 77 y.o. male admitted on 10/04/2023 with sepsis.  Pharmacy has been consulted for Vancomycin dosing.  Pt is AKI on CKD so will dose by levels til renal function stable.  Plan: Vancomycin 2500 mg IV X 1 loading dose ordered for 12/5 @ ~ 0400. - no scheduled doses ordered for now,  will dose by levels - will draw Vanc random on 12/6 @ ~ 0500.  Goal trough:  15 -20 mcg/mL    Height: 6' (182.9 cm) Weight: 108.9 kg (240 lb) IBW/kg (Calculated) : 77.6  Temp (24hrs), Avg:97.8 F (36.6 C), Min:97.8 F (36.6 C), Max:97.8 F (36.6 C)  Recent Labs  Lab 10/04/23 1803  WBC 32.4*  CREATININE 2.99*    Estimated Creatinine Clearance: 25.5 mL/min (A) (by C-G formula based on SCr of 2.99 mg/dL (H)).    No Known Allergies  Antimicrobials this admission:   >>    >>   Dose adjustments this admission:   Microbiology results:  BCx:   UCx:    Sputum:    MRSA PCR:   Thank you for allowing pharmacy to be a part of this patient's care.  Maijor Hornig D 10/05/2023 3:19 AM

## 2023-10-05 NOTE — ED Notes (Signed)
BP cuff repositioned. See new documented vital signs.

## 2023-10-05 NOTE — ED Notes (Signed)
MD Lucianne Muss made aware of patient's decreasing BP. Medications ordered. Patient refusing to take PO meds at this time. MD Lucianne Muss notified. MD states to try to administer PO medication later. IVF increased to 7mL/hr.

## 2023-10-05 NOTE — ED Notes (Signed)
Patient refusing 1200 meds at this time. MD Lucianne Muss notified.

## 2023-10-05 NOTE — ED Notes (Signed)
**   [facility], EDT CALLED TO TRANSPORT PT TO IN-PATIENT UNIT ASSIGNED

## 2023-10-05 NOTE — ED Notes (Signed)
Patient repositioned in bed by this RN and Emilee, EMT. Patient's foley leg bag emptied at this time. Patient's bed returned to lowest position. Call light in reach and wife at bedside.

## 2023-10-05 NOTE — ED Notes (Signed)
Pt took two Seroquel tablets and then refused to take other medications, wife reports that pt refuses his medications often at home as well.

## 2023-10-06 ENCOUNTER — Inpatient Hospital Stay: Payer: Medicare Other

## 2023-10-06 DIAGNOSIS — J90 Pleural effusion, not elsewhere classified: Secondary | ICD-10-CM | POA: Diagnosis not present

## 2023-10-06 LAB — BASIC METABOLIC PANEL
Anion gap: 14 (ref 5–15)
BUN: 81 mg/dL — ABNORMAL HIGH (ref 8–23)
CO2: 18 mmol/L — ABNORMAL LOW (ref 22–32)
Calcium: 7.7 mg/dL — ABNORMAL LOW (ref 8.9–10.3)
Chloride: 105 mmol/L (ref 98–111)
Creatinine, Ser: 2.74 mg/dL — ABNORMAL HIGH (ref 0.61–1.24)
GFR, Estimated: 23 mL/min — ABNORMAL LOW (ref >60)
Glucose, Bld: 101 mg/dL — ABNORMAL HIGH (ref 70–99)
Potassium: 3.4 mmol/L — ABNORMAL LOW (ref 3.5–5.1)
Sodium: 137 mmol/L (ref 135–145)

## 2023-10-06 LAB — VANCOMYCIN, RANDOM: Vancomycin Rm: 21 ug/mL

## 2023-10-06 LAB — CBC
HCT: 33.3 % — ABNORMAL LOW (ref 39.0–52.0)
Hemoglobin: 11.5 g/dL — ABNORMAL LOW (ref 13.0–17.0)
MCH: 30.4 pg (ref 26.0–34.0)
MCHC: 34.5 g/dL (ref 30.0–36.0)
MCV: 88.1 fL (ref 80.0–100.0)
Platelets: 364 10*3/uL (ref 150–400)
RBC: 3.78 MIL/uL — ABNORMAL LOW (ref 4.22–5.81)
RDW: 14 % (ref 11.5–15.5)
WBC: 26.1 10*3/uL — ABNORMAL HIGH (ref 4.0–10.5)
nRBC: 0 % (ref 0.0–0.2)

## 2023-10-06 LAB — MRSA NEXT GEN BY PCR, NASAL: MRSA by PCR Next Gen: NOT DETECTED

## 2023-10-06 LAB — MAGNESIUM: Magnesium: 2.6 mg/dL — ABNORMAL HIGH (ref 1.7–2.4)

## 2023-10-06 LAB — VITAMIN D 25 HYDROXY (VIT D DEFICIENCY, FRACTURES): Vit D, 25-Hydroxy: 7.37 ng/mL — ABNORMAL LOW (ref 30–100)

## 2023-10-06 LAB — PHOSPHORUS: Phosphorus: 5.3 mg/dL — ABNORMAL HIGH (ref 2.5–4.6)

## 2023-10-06 LAB — VITAMIN B12: Vitamin B-12: 1970 pg/mL — ABNORMAL HIGH (ref 180–914)

## 2023-10-06 MED ORDER — VITAMIN D (ERGOCALCIFEROL) 1.25 MG (50000 UNIT) PO CAPS
50000.0000 [IU] | ORAL_CAPSULE | ORAL | Status: DC
  Filled 2023-10-06: qty 1

## 2023-10-06 MED ORDER — FOLIC ACID 1 MG PO TABS
1.0000 mg | ORAL_TABLET | Freq: Every day | ORAL | Status: DC
  Administered 2023-10-06: 1 mg via ORAL
  Filled 2023-10-06 (×2): qty 1

## 2023-10-06 MED ORDER — SODIUM CHLORIDE 0.9 % IV SOLN: INTRAVENOUS | Status: DC

## 2023-10-06 MED ORDER — CHLORHEXIDINE GLUCONATE CLOTH 2 % EX PADS
6.0000 | MEDICATED_PAD | Freq: Every day | CUTANEOUS | Status: DC
  Administered 2023-10-06 – 2023-10-08 (×2): 6 via TOPICAL

## 2023-10-06 MED ORDER — POLYSACCHARIDE IRON COMPLEX 150 MG PO CAPS
150.0000 mg | ORAL_CAPSULE | Freq: Every day | ORAL | Status: DC
  Administered 2023-10-06: 150 mg via ORAL
  Filled 2023-10-06 (×3): qty 1

## 2023-10-06 MED ORDER — PIPERACILLIN-TAZOBACTAM 3.375 G IVPB
3.3750 g | Freq: Three times a day (TID) | INTRAVENOUS | Status: DC
  Administered 2023-10-06 – 2023-10-08 (×5): 3.375 g via INTRAVENOUS
  Filled 2023-10-06 (×7): qty 50

## 2023-10-06 MED ORDER — MIDODRINE HCL 5 MG PO TABS
5.0000 mg | ORAL_TABLET | Freq: Three times a day (TID) | ORAL | Status: DC
Start: 2023-10-06 — End: 2023-10-07

## 2023-10-06 MED ORDER — SODIUM BICARBONATE 650 MG PO TABS
650.0000 mg | ORAL_TABLET | Freq: Three times a day (TID) | ORAL | Status: DC
  Administered 2023-10-06: 650 mg via ORAL
  Filled 2023-10-06 (×3): qty 1

## 2023-10-06 MED ORDER — AZITHROMYCIN 500 MG PO TABS: 500.0000 mg | ORAL_TABLET | Freq: Every day | ORAL | Status: DC

## 2023-10-06 NOTE — Progress Notes (Signed)
[facility] Progress Note  Patient: Carl Mcclain    WUJ:[id number]  DOA: 10/04/2023     Date of Service: the patient was seen and examined on 10/06/2023  Chief Complaint  Patient presents with   Weakness   Brief hospital course: Carl Mcclain is a 77 y.o. 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  Wife gives history of patient is awake and restless pulling off leads reports that he does get confused at night and she gives him his Seroquel which helps him.  Patient is cooperative and is allowing to examine him.  No reports of fall fevers incontinence .  On initial signout from the emergency room doctor patient met sepsis criteria and had a white count of 32,000 and was concerning for for possible empyema with an chest a CT was requested.    Initial EKG shows sinus rhythm at 97 with no ST-T wave changes Initial chest x-ray showed right sided pleural effusion with loculation and therefore CT was requested. ED doctor discussed with IR radiology on call I spoke with Dr. Deanne Coffer on-call who states that IR will get him on the list for tomorrow for thoracentesis and potential pigtail placement if that does not fully remove all the fluid.   Admission requested for UTI with hematuria, leukocytosis, acute kidney injury, loculated right-sided pleural effusion, and sepsis.  Assessment and Plan:  # Right loculated pleural effusion most likely due to aspiration pneumonia CT scan reviewed S/p vancomycin, ceftriaxone and azithromycin Continue empiric antibiotics Zosyn IR consulted for thoracentesis, follow fluid studies when it would be done Patient refused thoracentesis, we will try again Follow psych for decision-making capacity, if patient has no capacity then patient's wife can sign the consent and we can sedate patient to get thoracentesis done if that will be  needed  # Sepsis due to aspiration pneumonia and pleural effusion S/p IV fluid Continue above antibiotics Currently vital signs stable Continue midodrine 5 mg p.o. 3 times daily with holding parameters  # Dysphagia Presented with generalized weakness, it could be due to sepsis, aspiration pneumonia and dysphagia, failure to thrive, dehydration. SLP eval done, recommended MBS MBS was done, recommended no oral diet, patient will need alternative method of feeding Patient's wife refused for PEG tube Patient may qualify for palliative care and hospice Follow-up palliative care for further discussion with family Continue IV fluid for hydration  # Possible UTI UA positive Urine culture was not sent as patient has chronic Foley which was placed on 11/22 Patient is refusing medical management, he may need refused replacement of Foley catheter so we cannot send urine culture in this case. Continue antibiotics.  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    # Dementia with behavioral issue: Cont Seroquel.  Palliative care consulted for goals of care discussion Psych consulted to eval him for decision-making capacity.  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 daily Vitamin B12 level 1970, very high, advised to discontinue  B12 supplement # Vitamin D deficiency: started vitamin D 50,000 units p.o. weekly, follow with PCP to repeat vitamin D level after 3 to 6 months.   Body mass index is 32.55 kg/m.   Interventions:  Diet: Heart healthy diet, fluid striction 1.5 L/day DVT Prophylaxis: Subcutaneous Lovenox   Advance goals of care discussion: Full code  Family Communication: family was not present at bedside, at the time of interview.  The pt provided permission to discuss medical plan with the family. Opportunity was given to ask question and all questions were answered satisfactorily.   Disposition:  Pt is from Home, admitted with sepsis, pleaural effusion, still on IV Abx, which precludes a safe discharge. Discharge to Home vs SNF TBD, when stable.  Subjective: No significant events overnight, today patient looks more awake and alert, AAO x 1 due to severe dementia, patient is refusing clinical management. Patient's wife was at bedside, management plan discussed.  Physical Exam: General: NAD, lying comfortably Appear in no distress, affect appropriate Eyes: PERRLA ENT: Oral Mucosa Clear, moist  Neck: no JVD,  Cardiovascular: S1 and S2 Present, no Murmur,  Respiratory: Equal air entry bilaterally, decreased breath sounds right lower chest most likely due to effusion.  No wheezing. Abdomen: Bowel Sound present, Soft and no tenderness,  Skin: no rashes Extremities: no Pedal edema, no calf tenderness Neurologic: without any new focal findings Gait not checked due to patient safety concerns  Vitals:   10/05/23 2145 10/05/23 2246 10/06/23 0335 10/06/23 0734  BP:  125/67 114/66 121/62  Pulse: 99 (!) 54 98 (!) 108  Resp: 15 16 18 17   Temp:  98.3 F (36.8 C) 98.7 F (37.1 C) 98.6 F (37 C)  TempSrc:  Oral    SpO2: 98% 96% 94% 93%  Weight:      Height:        Intake/Output Summary (Last 24 hours) at 10/06/2023 1450 Last data filed at 10/06/2023 0900 Gross per 24 hour  Intake 83.71 ml  Output 50 ml  Net 33.71 ml   Filed Weights   10/04/23 1758  Weight: 108.9 kg    Data Reviewed: I have personally reviewed and interpreted daily labs, tele strips, imagings as  discussed above. I reviewed all nursing notes, pharmacy notes, vitals, pertinent old records I have discussed plan of care as described above with RN and patient/family.  CBC: Recent Labs  Lab 10/04/23 1803 10/05/23 0402 10/06/23 0510  WBC 32.4* 27.7* 26.1*  NEUTROABS 29.5*  --   --   HGB 13.6 12.2* 11.5*  HCT 40.7 36.1* 33.3*  MCV 90.4 90.9 88.1  PLT 410* 365 364   Basic Metabolic Panel: Recent Labs  Lab 10/04/23 1803 10/05/23 0402 10/06/23 0510  NA 134* 136 137  K 3.9 3.9 3.4*  CL 99 103 105  CO2 21* 18* 18*  GLUCOSE 131* 107* 101*  BUN 53* 63* 81*  CREATININE 2.99* 2.90* 2.74*  CALCIUM 8.3* 7.5* 7.7*  MG 2.3 2.2 2.6*  PHOS  --  4.9* 5.3*    Studies: No results found.  Scheduled Meds:  azithromycin  500 mg Oral QHS   carbidopa-levodopa  1 tablet Oral q1800   carbidopa-levodopa  2 tablet Oral TID   Carbidopa-Levodopa ER  1 tablet Oral QID   Chlorhexidine Gluconate Cloth  6 each Topical Daily   enoxaparin (LOVENOX) injection  40 mg Subcutaneous QPM   folic acid  1 mg Oral Daily   iron polysaccharides  150 mg Oral Daily  midodrine  10 mg Oral TID WC   mirtazapine  30 mg Oral QHS   QUEtiapine  400 mg Oral QHS   sodium bicarbonate  650 mg Oral TID   sodium chloride flush  3 mL Intravenous Q12H   vancomycin variable dose per unstable renal function (pharmacist dosing)   Does not apply See admin instructions   Vitamin D (Ergocalciferol)  50,000 Units Oral Q7 days   Continuous Infusions:  cefTRIAXone (ROCEPHIN)  IV Stopped (10/05/23 2158)   PRN Meds: acetaminophen **OR** acetaminophen, albuterol, HYDROcodone-acetaminophen, morphine injection  Time spent: 55 minutes  Author: Gillis Santa. MD [facility] 10/06/2023 2:50 PM  To reach On-call, see care teams to locate the attending and reach out to them via [email or web address]. If 7PM-7AM, please contact night-coverage If you still have difficulty reaching the attending provider, please page the [facility] (Director  on Call) for [facility] on amion for assistance.

## 2023-10-06 NOTE — Plan of Care (Signed)
Request to IR for thoracentesis - patient evaluated for same yesterday but was uncooperative and declined procedure. Attempted to proceed with thoracentesis today however patient remains disagreeable.   Given patient refusal x 2 order for thoracentesis will be cancelled. Please place a new order for thoracentesis if patient becomes agreeable to procedure.  IR remains available as needed, please call with questions or concerns.  Lynnette Caffey, PA-C

## 2023-10-06 NOTE — Progress Notes (Signed)
PHARMACY NOTE:  ANTIMICROBIAL RENAL DOSAGE ADJUSTMENT  Current antimicrobial regimen includes a mismatch between antimicrobial dosage and estimated renal function.  As per policy approved by the Pharmacy & Therapeutics and Medical Executive Committees, the antimicrobial dosage will be adjusted accordingly.  Current antimicrobial dosage:  Zosyn 3.375 g IV q6H (30-minute infusion)  Indication: Aspiration pneumonia  Renal Function:  Estimated Creatinine Clearance: 27.9 mL/min (A) (by C-G formula based on SCr of 2.74 mg/dL (H)). []      On intermittent HD, scheduled: []      On CRRT    Antimicrobial dosage has been changed to:  Zosyn 3.375 g IV q8H (4-hour infusion)  Additional comments:   Thank you for allowing pharmacy to be a part of this patient's care.  Will M. Dareen Piano, PharmD Clinical Pharmacist 10/06/2023 3:11 PM

## 2023-10-06 NOTE — Plan of Care (Signed)
  Problem: Education: Goal: Knowledge of General Education information will improve Description: Including pain rating scale, medication(s)/side effects and non-pharmacologic comfort measures Outcome: Progressing   Problem: Clinical Measurements: Goal: Ability to maintain clinical measurements within normal limits will improve Outcome: Progressing Goal: Will remain free from infection Outcome: Progressing Goal: Diagnostic test results will improve Outcome: Progressing Goal: Respiratory complications will improve Outcome: Progressing Goal: Cardiovascular complication will be avoided Outcome: Progressing   Problem: Clinical Measurements: Goal: Will remain free from infection Outcome: Progressing   Problem: Nutrition: Goal: Adequate nutrition will be maintained Outcome: Progressing   Problem: Coping: Goal: Level of anxiety will decrease Outcome: Progressing

## 2023-10-06 NOTE — Progress Notes (Signed)
Speech Language Pathology Treatment: Dysphagia  Patient Details Name: Carl Mcclain MRN: [id number] DOB: Jun 28, 1946 Today's Date: 10/06/2023 Time: 1450-1510 SLP Time Calculation (min) (ACUTE ONLY): 20 min  Assessment / Plan / Recommendation Clinical Impression  This writer met with Carl Mcclain and Carl Mcclain following Carl Modified Barium Swallow Study. Results and nature of swallow impairment discussed with Carl Mcclain's Mcclain. Carl Mcclain unable to comprehend results as evidenced by Carl rambling speech during interaction. Video of swallow offered but Carl Mcclain's Mcclain declined to view. Recommend remains NPO d/t high risk of aspiration. Carl Mcclain's Mcclain voiced understanding of the above information and current recommendation. Palliative Care has been consulted and is aware of current recommendations. ST services will sign off at this time.    HPI HPI: 77yo male admitted 10/04/23 with weakness. PMH: Parkinson's, CHF, COPD, prostate cancer, HTN, HFrEF, anxiety, asthma, GERD, OSA, PNA. CXR = New moderate right pleural effusion with partial compressive atelectasis of the right lung versus pneumonia. Underlying mass is not excluded.      SLP Plan  Discharge SLP treatment due to (comment) (severity of dysphagia, defer to Palliative Care for GOC)      Recommendations for follow up therapy are one component of a multi-disciplinary discharge planning process, led by the attending physician.  Recommendations may be updated based on patient status, additional functional criteria and insurance authorization.    Recommendations  Diet recommendations: NPO Medication Administration: Via alternative means                  Oral care QID     Dysphagia, oropharyngeal phase (R13.12)     Discharge SLP treatment due to (comment) (severity of dysphagia, defer to Palliative Care for GOC)    Admire Bunnell B. Dreama Saa, M.S., CCC-SLP, Tree surgeon Certified Brain Injury Specialist [facility]  [facility] Services Office [phone] Ascom [phone] Fax [phone]

## 2023-10-06 NOTE — Consult Note (Signed)
Pharmacy Antibiotic Note  Carl Mcclain is a 77 y.o. male with PMH including Parkinson's, CHF, prostate cancer, hypertension, COPD, HFrEF with EF 20 to 25% admitted on 10/04/2023 with  sepsis in setting of suspected UTI and right large loculated pleural effusion . Appears IR was consulted to tap effusion but patient declined procedure.  Pharmacy has been consulted for vancomycin dosing. Patient is also ordered ceftriaxone and azithromycin.  Scr 2.99 on presentation, 2.74 today (baseline ~ 1.5?)  Plan:  Vancomycin 2.5 g IV LD given 12/5 AM --Random level this AM = 21 --Continue to dose per levels, re-check random level tomorrow AM --Follow cultures  Height: 6' (182.9 cm) Weight: 108.9 kg (240 lb) IBW/kg (Calculated) : 77.6  Temp (24hrs), Avg:98.3 F (36.8 C), Min:97.7 F (36.5 C), Max:98.7 F (37.1 C)  Recent Labs  Lab 10/04/23 1803 10/05/23 0402 10/05/23 0403 10/05/23 0751 10/06/23 0510  WBC 32.4* 27.7*  --   --  26.1*  CREATININE 2.99* 2.90*  --   --  2.74*  LATICACIDVEN  --   --  1.2 1.0  --   VANCORANDOM  --   --   --   --  21    Estimated Creatinine Clearance: 27.9 mL/min (A) (by C-G formula based on SCr of 2.74 mg/dL (H)).    No Known Allergies  Antimicrobials this admission: Azithromycin 12/4 >>  Ceftriaxone 12/4 >>  Vancomycin 12/5 >>   Dose adjustments this admission: N/A  Microbiology results: 12/6 MRSA PCR: pending  Thank you for allowing pharmacy to be a part of this patient's care.  Tressie Ellis 10/06/2023 10:39 AM

## 2023-10-06 NOTE — Procedures (Signed)
Modified Barium Swallow Study  Patient Details  Name: Carl Mcclain MRN: [id number] Date of Birth: Jul 08, 1946  Today's Date: 10/06/2023  Modified Barium Swallow completed.  Full report located under Chart Review in the Imaging Section.  History of Present Illness 77yo male admitted 10/04/23 with weakness. PMH: Parkinson's, CHF, COPD, prostate cancer, HTN, HFrEF, anxiety, asthma, GERD, OSA, PNA. CXR = New moderate right pleural effusion with partial compressive atelectasis of the right lung versus pneumonia. Underlying mass is not excluded.   Clinical Impression Pt with recent BSE earlier in day with recommendation for instrumental swallow study.   Given that pt had refused 2 attempts at thoracentesis, this writer visited with pt and his wife prior to study. Pt was agreeable to participating in study. At bedside his wife mentioned that he was "confused because he has dementia."       Pt presents with a profound sensory pharyngeal phase dysphagia which resulted in gross silent aspiration of thin liquids via spoon. Pt with visible attempts to swallow but was able to initiate swallow response with boluses resting in his pyriform sinuses and spilling into his open airway. In an effort to promote functional swallow response, pt was given a cup to hold during consumption. Even when bringing the empty cup to his mouth and attempting to "drink from the empty cup," he was not able initiate a swallow. Suspect pt's dysphagia is related to current cognitive state. At this time, recommend strict NPO with Palliative Care/Hospice to discuss GOC with pt's wife.   Factors that may increase risk of adverse event in presence of aspiration [employer] 2021): Reduced cognitive function;Limited mobility;Frail or deconditioned;Inadequate oral hygiene;Reduced saliva;Weak cough;Frequent aspiration of large volumes  Swallow Evaluation Recommendations Recommendations: NPO Medication Administration: Via alternative  means Oral care recommendations: Oral care QID (4x/day)   Joslynn Jamroz B. Dreama Saa, M.S., CCC-SLP, Tree surgeon Certified Brain Injury Specialist [facility]  [facility] Services Office [phone] Ascom [phone] Fax [phone]

## 2023-10-06 NOTE — Evaluation (Signed)
Clinical/Bedside Swallow Evaluation Patient Details  Name: Carl Mcclain MRN: [id number] Date of Birth: 04/27/1946  Today's Date: 10/06/2023 Time: SLP Start Time (ACUTE ONLY): 1250 SLP Stop Time (ACUTE ONLY): 1318 SLP Time Calculation (min) (ACUTE ONLY): 28 min  Past Medical History:  Past Medical History:  Diagnosis Date   Anxiety    Asthma    Cancer (HCC)    prostate   CHF (congestive heart failure) (HCC)    COPD (chronic obstructive pulmonary disease) (HCC)    Dyspnea    GERD (gastroesophageal reflux disease)    Hypertension    OSA (obstructive sleep apnea)    Parkinson disease (HCC)    Pneumonia    Past Surgical History:  Past Surgical History:  Procedure Laterality Date   INGUINAL HERNIA REPAIR Right 11/18/2021   Procedure: HERNIA REPAIR INGUINAL ADULT;  Surgeon: Sung Amabile, DO;  Location: [facility] ORS;  Service: General;  Laterality: Right;   INSERTION OF MESH  11/18/2021   Procedure: INSERTION OF MESH;  Surgeon: Sung Amabile, DO;  Location: [facility] ORS;  Service: General;;   LEFT HEART CATH AND CORONARY ANGIOGRAPHY N/A 04/18/2022   Procedure: LEFT HEART CATH AND CORONARY ANGIOGRAPHY;  Surgeon: Lamar Blinks, MD;  Location: [facility] INVASIVE CV LAB;  Service: Cardiovascular;  Laterality: N/A;   prostatectomy     Vastectomy     VEIN LIGATION AND STRIPPING     HPI:  77yo male admitted 10/04/23 with weakness. PMH: Parkinson's, CHF, COPD, prostate cancer, HTN, HFrEF, anxiety, asthma, GERD, OSA, PNA. CXR = New moderate right pleural effusion with partial compressive atelectasis of the right lung versus pneumonia. Underlying mass is not excluded.    Assessment / Plan / Recommendation  Clinical Impression  Pt presents with significantly high risk of aspiration across consistencies, with intermittently questionable secretion management. Oropharyngeal AND esophageal dysphagia are suspected.   Pt poorly cooperative with CN exam due to difficulty following directions in the setting of  dementia. Generalized orofacial weakness without obvious asymmetry noted. Pt's speech intelligibility is highly variable, with low vocal intensity. Pt unable to produce a volitional cough, only weak throat clear.   Pt accepted trials of thin liquid via cup and small boluses of puree. Immediate cough response elicited following both presentations, as well as change in voice quality and delayed coughing several minutes after PO trials had been discontinued. Recommend proceeding with MBS to objectively assess swallow function and physiology.  Wife indicated that feeding tube placement "is something that is not going to happen". Will complete instrumental assessment to determine least restrictive diet. RN/MD aware of results and recommendations.  SLP Visit Diagnosis: Dysphagia, unspecified (R13.10)    Aspiration Risk  Severe aspiration risk;Risk for inadequate nutrition/hydration    Diet Recommendation NPO  pending MBS       Other  Recommendations  Pending MBS   Recommendations for follow up therapy are one component of a multi-disciplinary discharge planning process, led by the attending physician.  Recommendations may be updated based on patient status, additional functional criteria and insurance authorization.  Follow up Recommendations Follow physician's recommendations for discharge plan and follow up therapies      Assistance Recommended at Discharge  24 hour supervision/assist  Functional Status Assessment Patient has had a recent decline in their functional status and/or demonstrates limited ability to make significant improvements in function in a reasonable and predictable amount of time   Frequency and Duration  (pending MBS)          Prognosis Prognosis for improved  oropharyngeal function: Guarded Barriers to Reach Goals: Severity of deficits;Cognitive deficits;Behavior      Swallow Study   General Date of Onset: 10/04/23 HPI: 77yo male admitted 10/04/23 with weakness.  PMH: Parkinson's, CHF, COPD, prostate cancer, HTN, HFrEF, anxiety, asthma, GERD, OSA, PNA. CXR = New moderate right pleural effusion with partial compressive atelectasis of the right lung versus pneumonia. Underlying mass is not excluded. Type of Study: Bedside Swallow Evaluation Previous Swallow Assessment: none Diet Prior to this Study: Regular;Thin liquids (Level 0) Temperature Spikes Noted: No Respiratory Status: Room air History of Recent Intubation: No Behavior/Cognition: Alert;Uncooperative;Requires cueing;Doesn't follow directions (Flat affect. Speech intelligibility variable) Oral Cavity Assessment: Other (comment) (Unable due to poor cooperation) Oral Care Completed by SLP: No Oral Cavity - Dentition: Missing dentition;Poor condition Vision: Functional for self-feeding Self-Feeding Abilities: Able to feed self;Needs assist Patient Positioning: Upright in bed Baseline Vocal Quality: Low vocal intensity (intermittent wet voice quality noted) Volitional Cough: Weak Volitional Swallow: Unable to elicit    Oral/Motor/Sensory Function Overall Oral Motor/Sensory Function: Generalized oral weakness   Ice Chips  Not tested  Thin Liquid Thin Liquid: Impaired Presentation: Cup Pharyngeal  Phase Impairments: Suspected delayed Swallow;Cough - Immediate;Cough - Delayed;Wet Vocal Quality;Decreased hyoid-laryngeal movement Other Comments: wife reports pt insists on using a straw at home, and consistently coughs after drinking.    Nectar Thick   Not tested  Honey Thick   Not tested  Puree Puree: Impaired Presentation: Spoon Oral Phase Functional Implications: Prolonged oral transit Pharyngeal Phase Impairments: Cough - Immediate;Cough - Delayed;Suspected delayed Swallow;Decreased hyoid-laryngeal movement;Wet Vocal Quality    Solid       Not tested     Emmaus Brandi B. Murvin Natal, MSP, CCC-SLP Speech Language Pathologist  [facility] 10/06/2023,1:51 PM

## 2023-10-07 DIAGNOSIS — J90 Pleural effusion, not elsewhere classified: Secondary | ICD-10-CM | POA: Diagnosis not present

## 2023-10-07 LAB — CBC
HCT: 35.8 % — ABNORMAL LOW (ref 39.0–52.0)
Hemoglobin: 12.3 g/dL — ABNORMAL LOW (ref 13.0–17.0)
MCH: 30.4 pg (ref 26.0–34.0)
MCHC: 34.4 g/dL (ref 30.0–36.0)
MCV: 88.4 fL (ref 80.0–100.0)
Platelets: 395 10*3/uL (ref 150–400)
RBC: 4.05 MIL/uL — ABNORMAL LOW (ref 4.22–5.81)
RDW: 14.1 % (ref 11.5–15.5)
WBC: 23.6 10*3/uL — ABNORMAL HIGH (ref 4.0–10.5)
nRBC: 0 % (ref 0.0–0.2)

## 2023-10-07 LAB — MAGNESIUM: Magnesium: 3 mg/dL — ABNORMAL HIGH (ref 1.7–2.4)

## 2023-10-07 LAB — BASIC METABOLIC PANEL
Anion gap: 9 (ref 5–15)
BUN: 86 mg/dL — ABNORMAL HIGH (ref 8–23)
CO2: 23 mmol/L (ref 22–32)
Calcium: 7.6 mg/dL — ABNORMAL LOW (ref 8.9–10.3)
Chloride: 108 mmol/L (ref 98–111)
Creatinine, Ser: 2.24 mg/dL — ABNORMAL HIGH (ref 0.61–1.24)
GFR, Estimated: 29 mL/min — ABNORMAL LOW (ref >60)
Glucose, Bld: 118 mg/dL — ABNORMAL HIGH (ref 70–99)
Potassium: 3.7 mmol/L (ref 3.5–5.1)
Sodium: 140 mmol/L (ref 135–145)

## 2023-10-07 LAB — VANCOMYCIN, RANDOM: Vancomycin Rm: 13 ug/mL

## 2023-10-07 LAB — PHOSPHORUS: Phosphorus: 4.7 mg/dL — ABNORMAL HIGH (ref 2.5–4.6)

## 2023-10-07 MED ORDER — SODIUM CHLORIDE 0.9 % IV SOLN: INTRAVENOUS | Status: DC

## 2023-10-07 NOTE — Plan of Care (Signed)

## 2023-10-07 NOTE — Progress Notes (Incomplete)
[facility] Progress Note  Patient: Carl Mcclain    WUJ:[id number]  DOA: 10/04/2023     Date of Service: the patient was seen and examined on 10/07/2023  Chief Complaint  Patient presents with   Weakness   Brief hospital course: Momodou Fracasso is a 77 y.o. 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  Wife gives history of patient is awake and restless pulling off leads reports that he does get confused at night and she gives him his Seroquel which helps him.  Patient is cooperative and is allowing to examine him.  No reports of fall fevers incontinence .  On initial signout from the emergency room doctor patient met sepsis criteria and had a white count of 32,000 and was concerning for for possible empyema with an chest a CT was requested.    Initial EKG shows sinus rhythm at 97 with no ST-T wave changes Initial chest x-ray showed right sided pleural effusion with loculation and therefore CT was requested. ED doctor discussed with IR radiology on call I spoke with Dr. Deanne Coffer on-call who states that IR will get him on the list for tomorrow for thoracentesis and potential pigtail placement if that does not fully remove all the fluid.   Admission requested for UTI with hematuria, leukocytosis, acute kidney injury, loculated right-sided pleural effusion, and sepsis.  Assessment and Plan:  # Right loculated pleural effusion most likely due to aspiration pneumonia CT scan reviewed S/p vancomycin, ceftriaxone and azithromycin Continue empiric antibiotics Zosyn IR consulted for thoracentesis, follow fluid studies when it would be done Patient refused thoracentesis, we will try again Follow psych for decision-making capacity, if patient has no capacity then patient's wife can sign the consent and we can sedate patient to get thoracentesis done if that will be  needed  # Sepsis due to aspiration pneumonia and pleural effusion S/p IV fluid Continue above antibiotics Currently vital signs stable Continue midodrine 5 mg p.o. 3 times daily with holding parameters 12/7 discontinued midodrine, BP improved.  # Dysphagia Presented with generalized weakness, it could be due to sepsis, aspiration pneumonia and dysphagia, failure to thrive, dehydration. SLP eval done, recommended MBS MBS was done, recommended no oral diet, patient will need alternative method of feeding Patient's wife refused for PEG tube Patient may qualify for palliative care and hospice Follow-up palliative care for further discussion with family Continue IV fluid for hydration  # Possible UTI UA positive Urine culture was not sent as patient has chronic Foley which was placed on 11/22 Patient is refusing medical management, he may need refused replacement of Foley catheter so we cannot send urine culture in this case. Continue antibiotics.  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    # Dementia with behavioral issue: Cont Seroquel.  Palliative care consulted for goals of care discussion Psych consulted to eval him for decision-making capacity.  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 daily Vitamin B12  level 1970, very high, advised to discontinue B12 supplement # Vitamin D deficiency: started vitamin D 50,000 units p.o. weekly, follow with PCP to repeat vitamin D level after 3 to 6  months.   Body mass index is 32.55 kg/m.  Interventions:  Diet: Heart healthy diet, fluid striction 1.5 L/day DVT Prophylaxis: Subcutaneous Lovenox   Advance goals of care discussion: Full code  Family Communication: family was not present at bedside, at the time of interview.  The pt provided permission to discuss medical plan with the family. Opportunity was given to ask question and all questions were answered satisfactorily.   Disposition:  Pt is from Home, admitted with sepsis, pleaural effusion, still on IV Abx, which precludes a safe discharge. Discharge to Home vs SNF TBD, when stable.  Subjective: No significant events overnight, today patient looks more awake and alert, AAO x 1 due to severe dementia, patient is refusing clinical management. Patient's wife was at bedside, management plan discussed.  Physical Exam: General: NAD, lying comfortably Appear in no distress, affect appropriate Eyes: PERRLA ENT: Oral Mucosa Clear, moist  Neck: no JVD,  Cardiovascular: S1 and S2 Present, no Murmur,  Respiratory: Equal air entry bilaterally, decreased breath sounds right lower chest most likely due to effusion.  No wheezing. Abdomen: Bowel Sound present, Soft and no tenderness,  Skin: no rashes Extremities: no Pedal edema, no calf tenderness Neurologic: without any new focal findings Gait not checked due to patient safety concerns  Vitals:   10/06/23 2049 10/07/23 0038 10/07/23 0438 10/07/23 0735  BP: 126/72 135/79 138/68 (!) 148/78  Pulse: 94 94 87 95  Resp: 18 18 20 20   Temp: 97.8 F (36.6 C) 98.1 F (36.7 C) 98 F (36.7 C) (!) 97.5 F (36.4 C)  TempSrc: Axillary   Oral  SpO2: 94% 94% 95% 93%  Weight:      Height:        Intake/Output Summary (Last 24 hours) at 10/07/2023 0846 Last data filed at 10/07/2023 4132 Gross per 24 hour  Intake 157.53 ml  Output 750 ml  Net -592.47 ml   Filed Weights   10/04/23 1758  Weight: 108.9 kg    Data Reviewed: I have  personally reviewed and interpreted daily labs, tele strips, imagings as discussed above. I reviewed all nursing notes, pharmacy notes, vitals, pertinent old records I have discussed plan of care as described above with RN and patient/family.  CBC: Recent Labs  Lab 10/04/23 1803 10/05/23 0402 10/06/23 0510 10/07/23 0330  WBC 32.4* 27.7* 26.1* 23.6*  NEUTROABS 29.5*  --   --   --   HGB 13.6 12.2* 11.5* 12.3*  HCT 40.7 36.1* 33.3* 35.8*  MCV 90.4 90.9 88.1 88.4  PLT 410* 365 364 395   Basic Metabolic Panel: Recent Labs  Lab 10/04/23 1803 10/05/23 0402 10/06/23 0510 10/07/23 0330  NA 134* 136 137 140  K 3.9 3.9 3.4* 3.7  CL 99 103 105 108  CO2 21* 18* 18* 23  GLUCOSE 131* 107* 101* 118*  BUN 53* 63* 81* 86*  CREATININE 2.99* 2.90* 2.74* 2.24*  CALCIUM 8.3* 7.5* 7.7* 7.6*  MG 2.3 2.2 2.6* 3.0*  PHOS  --  4.9* 5.3* 4.7*    Studies: DG Swallowing Func-Speech Pathology  Result Date: 10/06/2023 Table formatting from the original result was not included. Modified Barium Swallow Study Patient Details Name: Damontez Bayuk MRN: [id number] Date of Birth: 01-08-1946 Today's Date: 10/06/2023 HPI/PMH: HPI: 77yo male admitted 10/04/23 with weakness. PMH: Parkinson's, CHF,  COPD, prostate cancer, HTN, HFrEF, anxiety, asthma, GERD, OSA, PNA. CXR = New moderate right pleural effusion with partial compressive atelectasis of the right lung versus pneumonia. Underlying mass is not excluded. Clinical Impression: Clinical Impression: Given that pt had refused 2 attempts at thoracentesis, this writer visited with pt and his wife prior to study. Pt was agreeable to participating in study. At bedside his wife mentioned that he was "confused because he has dementia."     Pt presents with a profound sensory pharyngeal phase dysphagia which resulted in gross silent aspiration of thin liquids via spoon. Pt with visible attempts to swallow but was able to initiate swallow response with boluses resting in his  pyriform sinuses and spilling into his open airway. In an effort to promote functional swallow response, pt was given an empty cup to hold during consumption. Even when bringing the cup to his mouth and attempting to "drink from the cup," he was not able initiate a swallow. Suspect pt's dysphagia is related to current cognitive state. At this time, recommend strict NPO with Palliative Care/Hospice to discuss GOC with pt's wife. Factors that may increase risk of adverse event in presence of aspiration Rubye Oaks & Clearance Coots 2021): Factors that may increase risk of adverse event in presence of aspiration Rubye Oaks & Clearance Coots 2021): Reduced cognitive function; Limited mobility; Frail or deconditioned; Inadequate oral hygiene; Reduced saliva; Weak cough; Frequent aspiration of large volumes Recommendations/Plan: Swallowing Evaluation Recommendations Swallowing Evaluation Recommendations Recommendations: NPO Medication Administration: Via alternative means Oral care recommendations: Oral care QID (4x/day) Treatment Plan Treatment Plan Treatment recommendations: No treatment recommended at this time Follow-up recommendations: No SLP follow up Interventions: Patient/family education Recommendations Recommendations for follow up therapy are one component of a multi-disciplinary discharge planning process, led by the attending physician.  Recommendations may be updated based on patient status, additional functional criteria and insurance authorization. Assessment: Orofacial Exam: Orofacial Exam Oral Cavity: Oral Hygiene: WFL Oral Cavity - Dentition: Missing dentition; Poor condition Orofacial Anatomy: WFL Oral Motor/Sensory Function: WFL Anatomy: Anatomy: WFL Boluses Administered: Boluses Administered Boluses Administered: Thin liquids (Level 0)  Oral Impairment Domain: Oral Impairment Domain Lip Closure: No labial escape Tongue control during bolus hold: Not tested (pt unable to understand) Bolus transport/lingual motion: Slow  tongue motion Oral residue: Trace residue lining oral structures Initiation of pharyngeal swallow : No visible initiation at any location  Pharyngeal Impairment Domain: Pharyngeal Impairment Domain Soft palate elevation: No bolus between soft palate (SP)/pharyngeal wall (PW) Laryngeal elevation: No superior movement of thyroid cartilage Anterior hyoid excursion: No anterior movement Epiglottic movement: No inversion Laryngeal vestibule closure: None, wide column air/contrast in laryngeal vestibule Pharyngeal stripping wave : Absent Tongue base retraction: Trace column of contrast or air between tongue base and PPW; Narrow column of contrast or air between tongue base and PPW Pharyngeal residue: Collection of residue within or on pharyngeal structures; Majority of contrast within or on pharyngeal structures; Minimal to no pharyngeal clearance Location of pharyngeal residue: Valleculae; Pharyngeal wall; Aryepiglottic folds; Pyriform sinuses; Diffuse (>3 areas)  Esophageal Impairment Domain: No data recorded Pill: No data recorded Penetration/Aspiration Scale Score: Penetration/Aspiration Scale Score 8.  Material enters airway, passes BELOW cords without attempt by patient to eject out (silent aspiration) : Thin liquids (Level 0) Compensatory Strategies: Compensatory Strategies Compensatory strategies: -- (empty cup provided to stimulate functionality of task - not effective)   General Information: Caregiver present: No  Diet Prior to this Study: Regular; Thin liquids (Level 0)   Temperature : Normal  Respiratory Status: WFL   Supplemental O2: None (Room air)   History of Recent Intubation: No  Behavior/Cognition: Alert; Distractible; Requires cueing; Doesn't follow directions Self-Feeding Abilities: Needs assist with self-feeding Baseline vocal quality/speech: -- (Wet) Volitional Cough: Unable to elicit Volitional Swallow: Unable to elicit Exam Limitations: No limitations Goal Planning: Prognosis for improved  oropharyngeal function: -- (Poor) Barriers to Reach Goals: Cognitive deficits; Motivation; Time post onset; Severity of deficits; Behavior; Overall medical prognosis No data recorded Patient/Family Stated Goal: Wife indicated feeding tube placement is "something that's not going to happen" Consulted and agree with results and recommendations: Physician; Advanced practice provider; Nurse Pain: Pain Assessment Pain Assessment: No/denies pain End of Session: Start Time:SLP Start Time (ACUTE ONLY): 1450 Stop Time: SLP Stop Time (ACUTE ONLY): 1510 Time Calculation:SLP Time Calculation (min) (ACUTE ONLY): 20 min Charges: SLP Evaluations $ SLP Speech Visit: 1 Visit SLP Evaluations $BSS Swallow: 1 Procedure $MBS Swallow: 1 Procedure $Self Care/Home Management: 8-22 SLP visit diagnosis: SLP Visit Diagnosis: Dysphagia, oropharyngeal phase (R13.12) Past Medical History: Past Medical History: Diagnosis Date  Anxiety   Asthma   Cancer (HCC)   prostate  CHF (congestive heart failure) (HCC)   COPD (chronic obstructive pulmonary disease) (HCC)   Dyspnea   GERD (gastroesophageal reflux disease)   Hypertension   OSA (obstructive sleep apnea)   Parkinson disease (HCC)   Pneumonia  Past Surgical History: Past Surgical History: Procedure Laterality Date  INGUINAL HERNIA REPAIR Right 11/18/2021  Procedure: HERNIA REPAIR INGUINAL ADULT;  Surgeon: Sung Amabile, DO;  Location: [facility] ORS;  Service: General;  Laterality: Right;  INSERTION OF MESH  11/18/2021  Procedure: INSERTION OF MESH;  Surgeon: Sung Amabile, DO;  Location: [facility] ORS;  Service: General;;  LEFT HEART CATH AND CORONARY ANGIOGRAPHY N/A 04/18/2022  Procedure: LEFT HEART CATH AND CORONARY ANGIOGRAPHY;  Surgeon: Lamar Blinks, MD;  Location: [facility] INVASIVE CV LAB;  Service: Cardiovascular;  Laterality: N/A;  prostatectomy    Vastectomy    VEIN LIGATION AND STRIPPING   Happi Overton 10/06/2023, 3:26 PM   Scheduled Meds:  carbidopa-levodopa  1 tablet Oral q1800    carbidopa-levodopa  2 tablet Oral TID   Carbidopa-Levodopa ER  1 tablet Oral QID   Chlorhexidine Gluconate Cloth  6 each Topical Daily   enoxaparin (LOVENOX) injection  40 mg Subcutaneous QPM   folic acid  1 mg Oral Daily   iron polysaccharides  150 mg Oral Daily   mirtazapine  30 mg Oral QHS   QUEtiapine  400 mg Oral QHS   sodium bicarbonate  650 mg Oral TID   sodium chloride flush  3 mL Intravenous Q12H   Vitamin D (Ergocalciferol)  50,000 Units Oral Q7 days   Continuous Infusions:  sodium chloride 100 mL/hr at 10/07/23 0611   piperacillin-tazobactam 3.375 g (10/07/23 0611)   PRN Meds: acetaminophen **OR** acetaminophen, albuterol, HYDROcodone-acetaminophen, morphine injection  Time spent: 55 minutes  Author: Gillis Santa. MD [facility] 10/07/2023 8:46 AM  To reach On-call, see care teams to locate the attending and reach out to them via [email or web address]. If 7PM-7AM, please contact night-coverage If you still have difficulty reaching the attending provider, please page the [facility] (Director on Call) for [facility] on amion for assistance.

## 2023-10-07 NOTE — Plan of Care (Signed)

## 2023-10-07 NOTE — Progress Notes (Signed)
[facility] Progress Note  Patient: Carl Mcclain    NGE:[id number]  DOA: 10/04/2023     Date of Service: the patient was seen and examined on 10/07/2023  Chief Complaint  Patient presents with   Weakness   Brief hospital course: Akhari Washko is a 77 y.o. 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  Wife gives history of patient is awake and restless pulling off leads reports that he does get confused at night and she gives him his Seroquel which helps him.  Patient is cooperative and is allowing to examine him.  No reports of fall fevers incontinence .  On initial signout from the emergency room doctor patient met sepsis criteria and had a white count of 32,000 and was concerning for for possible empyema with an chest a CT was requested.    Initial EKG shows sinus rhythm at 97 with no ST-T wave changes Initial chest x-ray showed right sided pleural effusion with loculation and therefore CT was requested. ED doctor discussed with IR radiology on call I spoke with Dr. Deanne Coffer on-call who states that IR will get him on the list for tomorrow for thoracentesis and potential pigtail placement if that does not fully remove all the fluid.   Admission requested for UTI with hematuria, leukocytosis, acute kidney injury, loculated right-sided pleural effusion, and sepsis.  Assessment and Plan:  # Right loculated pleural effusion most likely due to aspiration pneumonia CT scan reviewed S/p vancomycin, ceftriaxone and azithromycin Continue empiric antibiotics Zosyn IR consulted for thoracentesis, follow fluid studies when it would be done Patient refused thoracentesis, we will try again Follow psych for decision-making capacity, if patient has no capacity then patient's wife can sign the consent and we can sedate patient to get thoracentesis done if that will be  needed 12/7 discussed with patient's wife regarding palliative and hospice care.  # Sepsis due to aspiration pneumonia and pleural effusion S/p IV fluid Continue above antibiotics Currently vital signs stable Continue midodrine 5 mg p.o. 3 times daily with holding parameters  # Dysphagia Presented with generalized weakness, it could be due to sepsis, aspiration pneumonia and dysphagia, failure to thrive, dehydration. SLP eval done, recommended MBS MBS was done, recommended no oral diet, patient will need alternative method of feeding Patient's wife refused for PEG tube Patient may qualify for palliative care and hospice Follow-up palliative care for further discussion with family Continue IV fluid for hydration  # Possible UTI UA positive Urine culture was not sent as patient has chronic Foley which was placed on 11/22 Patient is refusing medical management, he may need refused replacement of Foley catheter so we cannot send urine culture in this case. Continue antibiotics.  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    # Dementia with behavioral issue: Cont Seroquel.  Palliative care consulted for goals of care discussion Psych consulted to eval him for decision-making capacity.  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  daily Vitamin B12 level 1970, very high, advised to discontinue B12 supplement # Vitamin D deficiency: started vitamin D 50,000 units p.o. weekly, follow with PCP to repeat vitamin D level  after 3 to 6 months.   Body mass index is 32.55 kg/m.  Interventions:  Diet: Heart healthy diet, fluid striction 1.5 L/day DVT Prophylaxis: Subcutaneous Lovenox   Advance goals of care discussion: Full code  Family Communication: family was present at bedside, at the time of interview.  The pt provided permission to discuss medical plan with the family. Opportunity was given to ask question and all questions were answered satisfactorily.  12/7 goals of care and management plan discussed with patient's wife, and his brother. He is appropriate for palliative care and hospice  Disposition:  Pt is from Home, admitted with sepsis, pleaural effusion, still on IV Abx, which precludes a safe discharge. Discharge to Home vs SNF TBD, when stable.  Subjective: No significant events overnight, patient was lying comfortably, denied any active issues.  Patient was having secretions and needed help with suction.   Physical Exam: General: NAD, lying comfortably Appear in no distress, affect appropriate Eyes: PERRLA ENT: Oral Mucosa Clear, moist  Neck: no JVD,  Cardiovascular: S1 and S2 Present, no Murmur,  Respiratory: Equal air entry bilaterally, decreased breath sounds right lower chest most likely due to effusion.  No wheezing. Abdomen: Bowel Sound present, Soft and no tenderness,  Skin: no rashes Extremities: no Pedal edema, no calf tenderness Neurologic: without any new focal findings Gait not checked due to patient safety concerns  Vitals:   10/07/23 0038 10/07/23 0438 10/07/23 0735 10/07/23 1118  BP: 135/79 138/68 (!) 148/78 (!) 145/65  Pulse: 94 87 95 89  Resp: 18 20 20 18   Temp: 98.1 F (36.7 C) 98 F (36.7 C) (!) 97.5 F (36.4 C) 98 F (36.7 C)  TempSrc:   Oral   SpO2: 94% 95% 93% 96%  Weight:      Height:        Intake/Output Summary (Last 24 hours) at 10/07/2023 1513 Last data filed at 10/07/2023 1400 Gross per 24 hour  Intake 157.53 ml  Output 1850 ml  Net  -1692.47 ml   Filed Weights   10/04/23 1758  Weight: 108.9 kg    Data Reviewed: I have personally reviewed and interpreted daily labs, tele strips, imagings as discussed above. I reviewed all nursing notes, pharmacy notes, vitals, pertinent old records I have discussed plan of care as described above with RN and patient/family.  CBC: Recent Labs  Lab 10/04/23 1803 10/05/23 0402 10/06/23 0510 10/07/23 0330  WBC 32.4* 27.7* 26.1* 23.6*  NEUTROABS 29.5*  --   --   --   HGB 13.6 12.2* 11.5* 12.3*  HCT 40.7 36.1* 33.3* 35.8*  MCV 90.4 90.9 88.1 88.4  PLT 410* 365 364 395   Basic Metabolic Panel: Recent Labs  Lab 10/04/23 1803 10/05/23 0402 10/06/23 0510 10/07/23 0330  NA 134* 136 137 140  K 3.9 3.9 3.4* 3.7  CL 99 103 105 108  CO2 21* 18* 18* 23  GLUCOSE 131* 107* 101* 118*  BUN 53* 63* 81* 86*  CREATININE 2.99* 2.90* 2.74* 2.24*  CALCIUM 8.3* 7.5* 7.7* 7.6*  MG 2.3 2.2 2.6* 3.0*  PHOS  --  4.9* 5.3* 4.7*    Studies: No results found.  Scheduled Meds:  carbidopa-levodopa  1 tablet Oral q1800   carbidopa-levodopa  2 tablet Oral TID   Carbidopa-Levodopa ER  1 tablet Oral  QID   Chlorhexidine Gluconate Cloth  6 each Topical Daily   enoxaparin (LOVENOX) injection  40 mg Subcutaneous QPM   folic acid  1 mg Oral Daily   iron polysaccharides  150 mg Oral Daily   mirtazapine  30 mg Oral QHS   QUEtiapine  400 mg Oral QHS   sodium bicarbonate  650 mg Oral TID   sodium chloride flush  3 mL Intravenous Q12H   Vitamin D (Ergocalciferol)  50,000 Units Oral Q7 days   Continuous Infusions:  sodium chloride 100 mL/hr at 10/07/23 0856   piperacillin-tazobactam 3.375 g (10/07/23 1500)   PRN Meds: acetaminophen **OR** acetaminophen, albuterol, HYDROcodone-acetaminophen, morphine injection  Time spent: 55 minutes  Author: Gillis Santa. MD [facility] 10/07/2023 3:13 PM  To reach On-call, see care teams to locate the attending and reach out to them via  [email or web address]. If 7PM-7AM, please contact night-coverage If you still have difficulty reaching the attending provider, please page the [facility] (Director on Call) for [facility] on amion for assistance.

## 2023-10-07 NOTE — Consult Note (Signed)
[facility] Face-to-Face Psychiatry Consult   Reason for Consult:  capacity evaluation request Referring Physician:  Dr Carl Mcclain Patient Identification: Carl Mcclain MRN:  [id number] Principal Diagnosis: Pleural effusion on right Diagnosis:  Principal Problem:   Pleural effusion on right   Total Time spent with patient: 1 hour  Subjective:   Carl Mcclain is a 77 y.o. male patient admitted with pleural effusion, history of Parkinson's disease.  HPI:  Dr. Lucianne Mcclain placed a psychiatric consult on Carl Mcclain today to assess for medical decision making capacity. On exam, Carl Mcclain was able to provide his name, but was confused to location, time, and situation. He responded that he believed he was "near Carl Mcclain", which his wife at the bedside reported that they used to live on [address] He stated that the month was January and that it was winter currently. Carl Mcclain took long pauses to answer questions and when he could not answer, he stated, "I just wanted to see what you would say". Family at bedside said that Carl Mcclain uses humor to evade answering questions that he is unsure of due to memory issues.  Other questions were asked during the assessment.  When asked about how Carl Mcclain would respond in an emergency situation such as falling at home alone and not being able to get up, he said "well I would call my family". When asked if he could not get into contact with any family, who would he call, he stated, "I'm not going to give out my number to people I don't know, Would you want your number out there with people you don't know?". When interviewer clarified and asked what number could he dial in an emergency, Carl Mcclain responded "I don't know". When Carl Mcclain was asked if he knew the number 911 and the purpose of the number, he responded "no".   His wife and brother were at his bedside and stated that Carl Mcclain is at his baseline - he is unsure of where is at most of the time and what is going on. The team stepped  outside to discuss the case.  His wife, Carl Mcclain, reported she is his power of attorney with signed papers at home that she can bring them to the hospital. She and his brother stated that they discussed end of life decisions when he "was in his right mind" and he wanted no life extending measures to prolong his life as he has struggled with Parkinson's disease for years.   An in depth conversation pursued with Carl Mcclain regarding patient's condition and her wishes. Carl Mcclain wished to pursue palliative/hospice care for Carl Mcclain and "not prolong his suffering". Palliative care was already consulted and they were waiting for their visit. She does not want him to proceed with the thoracentesis as he became very agitated when they tried to perform this procedure earlier and she does not wish to keep putting her husband through this.   The client does not have capacity to make independent medical decisions and his POA, his wife Carl Mcclain), does not want to proceed with the thoracentesis.  She wants to work with Palliative Care to start comfort measures and next steps for Carl Mcclain.  His brother interjected agreement throughout the conversation.  Emotional support provided and a reminder that we are here for Carl Mcclain and the family and to please reach out if for anything they needed during this difficult time.   Past Psychiatric History: Parkinson's disease   Past Medical History:  Past Medical History:  Diagnosis Date   Anxiety  Asthma    Cancer (HCC)    prostate   CHF (congestive heart failure) (HCC)    COPD (chronic obstructive pulmonary disease) (HCC)    Dyspnea    GERD (gastroesophageal reflux disease)    Hypertension    OSA (obstructive sleep apnea)    Parkinson disease (HCC)    Pneumonia     Past Surgical History:  Procedure Laterality Date   INGUINAL HERNIA REPAIR Right 11/18/2021   Procedure: HERNIA REPAIR INGUINAL ADULT;  Surgeon: Sung Amabile, DO;  Location: [facility] ORS;  Service: General;   Laterality: Right;   INSERTION OF MESH  11/18/2021   Procedure: INSERTION OF MESH;  Surgeon: Sung Amabile, DO;  Location: [facility] ORS;  Service: General;;   LEFT HEART CATH AND CORONARY ANGIOGRAPHY N/A 04/18/2022   Procedure: LEFT HEART CATH AND CORONARY ANGIOGRAPHY;  Surgeon: Lamar Blinks, MD;  Location: [facility] INVASIVE CV LAB;  Service: Cardiovascular;  Laterality: N/A;   prostatectomy     Vastectomy     VEIN LIGATION AND STRIPPING     Family History: History reviewed. No pertinent family history. Family Psychiatric  History: None Social History:  Social History   Substance and Sexual Activity  Alcohol Use Never     Social History   Substance and Sexual Activity  Drug Use Never    Social History   Socioeconomic History   Marital status: Married    Spouse name: Carl Mcclain   Number of children: 4   Years of education: Not on file   Highest education level: Not on file  Occupational History   Not on file  Tobacco Use   Smoking status: Former    Current packs/day: 0.00    Average packs/day: 1 pack/day for 20.0 years (20.0 ttl pk-yrs)    Types: Cigarettes    Start date: 10/31/1958    Quit date: 10/31/1978    Years since quitting: 44.9   Smokeless tobacco: Never  Vaping Use   Vaping status: Never Used  Substance and Sexual Activity   Alcohol use: Never   Drug use: Never   Sexual activity: Yes  Other Topics Concern   Not on file  Social History Narrative   Not on file   Social Determinants of Health   Financial Resource Strain: Low Risk  (06/11/2023)   Received from [facility]   Overall Financial Resource Strain (CARDIA)    Difficulty of Paying Living Expenses: Not hard at all  Food Insecurity: No Food Insecurity (10/07/2023)   Hunger Vital Sign    Worried About Running Out of Food in the Last Year: Never true    Ran Out of Food in the Last Year: Never true  Transportation Needs: Patient Unable To Answer (10/07/2023)   PRAPARE - Transportation     Lack of Transportation (Medical): Patient unable to answer    Lack of Transportation (Non-Medical): Patient unable to answer  Physical Activity: Insufficiently Active (03/18/2020)   Received from [facility], [facility]   Exercise Vital Sign    Days of Exercise per Week: 3 days    Minutes of Exercise per Session: 40 min  Stress: Stress Concern Present (03/18/2020)   Received from [facility], [facility]   [employer] of Occupational Health - Occupational Stress Questionnaire    Feeling of Stress : To some extent  Social Connections: Socially Isolated (03/18/2020)   Received from [facility], [facility]   Social  Connection and Isolation Panel [NHANES]    Frequency of Communication with Friends and Family: Once a week    Frequency of Social Gatherings with Friends and Family: Once a week    Attends Religious Services: Never    Database administrator or Organizations: No    Attends Banker Meetings: Never    Marital Status: Married   Additional Social History:    Allergies:  No Known Allergies  Labs:  Results for orders placed or performed during the hospital encounter of 10/04/23 (from the past 48 hour(s))  Vitamin B12     Status: Abnormal   Collection Time: 10/05/23 10:40 PM  Result Value Ref Range   Vitamin B-12 1,970 (H) 180 - 914 pg/mL    Comment: (NOTE) This assay is not validated for testing neonatal or myeloproliferative syndrome specimens for Vitamin B12 levels. Performed at [facility], [address], [place], [place] [address]   Lactate dehydrogenase     Status: Abnormal   Collection Time: 10/05/23 10:40 PM  Result Value Ref Range   LDH 88 (L) 98 - 192 U/L    Comment: Performed at [facility], [address] [place], [place] [address]  VITAMIN D 25 Hydroxy (Vit-D Deficiency, Fractures)     Status: Abnormal   Collection  Time: 10/05/23 10:40 PM  Result Value Ref Range   Vit D, 25-Hydroxy 7.37 (L) 30 - 100 ng/mL    Comment: (NOTE) Vitamin D deficiency has been defined by the Institute of Medicine  and an Endocrine Society practice guideline as a level of serum 25-OH  vitamin D less than 20 ng/mL (1,2). The Endocrine Society went on to  further define vitamin D insufficiency as a level between 21 and 29  ng/mL (2).  1. IOM (Institute of Medicine). 2010. Dietary reference intakes for  calcium and D. [place]: [employer]. 2. Holick MF, Binkley Franquez, Bischoff-Ferrari HA, et al. Evaluation,  treatment, and prevention of vitamin D deficiency: an Endocrine  Society clinical practice guideline, JCEM. 2011 Jul; 96(7): 1911-30.  Performed at [facility], [address], [place], [place] [address]   Vancomycin, random     Status: None   Collection Time: 10/06/23  5:10 AM  Result Value Ref Range   Vancomycin Rm 21 ug/mL    Comment:        Random Vancomycin therapeutic range is dependent on dosage and time of specimen collection. A peak range is 20-40 ug/mL A trough range is 5-15 ug/mL        Performed at [facility], [address] [place], [place] [address]   Basic metabolic panel     Status: Abnormal   Collection Time: 10/06/23  5:10 AM  Result Value Ref Range   Sodium 137 135 - 145 mmol/L   Potassium 3.4 (L) 3.5 - 5.1 mmol/L   Chloride 105 98 - 111 mmol/L   CO2 18 (L) 22 - 32 mmol/L   Glucose, Bld 101 (H) 70 - 99 mg/dL    Comment: Glucose reference range applies only to samples taken after fasting for at least 8 hours.   BUN 81 (H) 8 - 23 mg/dL   Creatinine, Ser 9.41 (H) 0.61 - 1.24 mg/dL   Calcium 7.7 (L) 8.9 - 10.3 mg/dL   GFR, Estimated 23 (L) >60 mL/min    Comment: (NOTE) Calculated using the CKD-EPI Creatinine Equation (2021)    Anion gap 14 5 - 15  Comment: Performed at [facility], [address] [place], [place] [address]  CBC      Status: Abnormal   Collection Time: 10/06/23  5:10 AM  Result Value Ref Range   WBC 26.1 (H) 4.0 - 10.5 K/uL   RBC 3.78 (L) 4.22 - 5.81 MIL/uL   Hemoglobin 11.5 (L) 13.0 - 17.0 g/dL   HCT 60.4 (L) 54.0 - 98.1 %   MCV 88.1 80.0 - 100.0 fL   MCH 30.4 26.0 - 34.0 pg   MCHC 34.5 30.0 - 36.0 g/dL   RDW 19.1 47.8 - 29.5 %   Platelets 364 150 - 400 K/uL   nRBC 0.0 0.0 - 0.2 %    Comment: Performed at [facility], [address], [place], [place] [address]  Magnesium     Status: Abnormal   Collection Time: 10/06/23  5:10 AM  Result Value Ref Range   Magnesium 2.6 (H) 1.7 - 2.4 mg/dL    Comment: Performed at [facility], [address] [place], [place] [address]  Phosphorus     Status: Abnormal   Collection Time: 10/06/23  5:10 AM  Result Value Ref Range   Phosphorus 5.3 (H) 2.5 - 4.6 mg/dL    Comment: Performed at [facility], [address] [place], [place] [address]  MRSA Next Gen by PCR, Nasal     Status: None   Collection Time: 10/06/23  2:51 PM   Specimen: Nasal Mucosa; Nasal Swab  Result Value Ref Range   MRSA by PCR Next Gen NOT DETECTED NOT DETECTED    Comment: (NOTE) The GeneXpert MRSA Assay (FDA approved for NASAL specimens only), is one component of a comprehensive MRSA colonization surveillance program. It is not intended to diagnose MRSA infection nor to guide or monitor treatment for MRSA infections. Test performance is not FDA approved in patients less than 57 years old. Performed at [facility], [address] [place], [place] [address]   Basic metabolic panel     Status: Abnormal   Collection Time: 10/07/23  3:30 AM  Result Value Ref Range   Sodium 140 135 - 145 mmol/L   Potassium 3.7 3.5 - 5.1 mmol/L   Chloride 108 98 - 111 mmol/L   CO2 23 22 - 32 mmol/L   Glucose, Bld 118 (H) 70 - 99 mg/dL    Comment: Glucose reference range applies only to samples taken after fasting for at least 8 hours.   BUN 86 (H) 8 - 23  mg/dL   Creatinine, Ser 1.32 (H) 0.61 - 1.24 mg/dL   Calcium 7.6 (L) 8.9 - 10.3 mg/dL   GFR, Estimated 29 (L) >60 mL/min    Comment: (NOTE) Calculated using the CKD-EPI Creatinine Equation (2021)    Anion gap 9 5 - 15    Comment: Performed at [facility], [address] [place], [place] [address]  CBC     Status: Abnormal   Collection Time: 10/07/23  3:30 AM  Result Value Ref Range   WBC 23.6 (H) 4.0 - 10.5 K/uL   RBC 4.05 (L) 4.22 - 5.81 MIL/uL   Hemoglobin 12.3 (L) 13.0 - 17.0 g/dL   HCT 27.2 (L) 53.6 - 64.4 %   MCV 88.4 80.0 - 100.0 fL   MCH 30.4 26.0 - 34.0 pg   MCHC 34.4 30.0 - 36.0 g/dL   RDW 03.4 74.2 - 59.5 %   Platelets 395 150 - 400 K/uL   nRBC 0.0 0.0 -  0.2 %    Comment: Performed at [facility], [address] [place], [place] [address]  Magnesium     Status: Abnormal   Collection Time: 10/07/23  3:30 AM  Result Value Ref Range   Magnesium 3.0 (H) 1.7 - 2.4 mg/dL    Comment: Performed at [facility], [address] [place], [place] [address]  Phosphorus     Status: Abnormal   Collection Time: 10/07/23  3:30 AM  Result Value Ref Range   Phosphorus 4.7 (H) 2.5 - 4.6 mg/dL    Comment: Performed at [facility], [address] [place], [place] [address]  Vancomycin, random     Status: None   Collection Time: 10/07/23  3:30 AM  Result Value Ref Range   Vancomycin Rm 13 ug/mL    Comment:        Random Vancomycin therapeutic range is dependent on dosage and time of specimen collection. A peak range is 20-40 ug/mL A trough range is 5-15 ug/mL        Performed at [facility], [address] [place], [place] [address]     Current Facility-Administered Medications  Medication Dose Route Frequency Provider Last Rate Last Admin   0.9 %  sodium chloride infusion   Intravenous Continuous Gillis Santa, MD 100 mL/hr at 10/07/23 0856 Restarted at 10/07/23 0856   acetaminophen (TYLENOL) tablet 650 mg  650 mg Oral  Q6H PRN Gertha Calkin, MD       Or   acetaminophen (TYLENOL) suppository 650 mg  650 mg Rectal Q6H PRN Gertha Calkin, MD       albuterol (PROVENTIL) (2.5 MG/3ML) 0.083% nebulizer solution 2.5 mg  2.5 mg Inhalation Q6H PRN Gertha Calkin, MD       carbidopa-levodopa (SINEMET IR) 25-100 MG per tablet immediate release 1 tablet  1 tablet Oral q1800 Gertha Calkin, MD       carbidopa-levodopa (SINEMET IR) 25-100 MG per tablet immediate release 2 tablet  2 tablet Oral TID Gertha Calkin, MD   2 tablet at 10/06/23 1232   Carbidopa-Levodopa ER (SINEMET CR) 25-100 MG tablet controlled release 1 tablet  1 tablet Oral QID Gertha Calkin, MD   1 tablet at 10/06/23 1018   Chlorhexidine Gluconate Cloth 2 % PADS 6 each  6 each Topical Daily Gillis Santa, MD   6 each at 10/06/23 2124   enoxaparin (LOVENOX) injection 40 mg  40 mg Subcutaneous QPM Gillis Santa, MD   40 mg at 10/06/23 1700   folic acid (FOLVITE) tablet 1 mg  1 mg Oral Daily Gillis Santa, MD   1 mg at 10/06/23 1018   HYDROcodone-acetaminophen (NORCO/VICODIN) 5-325 MG per tablet 1 tablet  1 tablet Oral Q4H PRN Gertha Calkin, MD       iron polysaccharides (NIFEREX) capsule 150 mg  150 mg Oral Daily Gillis Santa, MD   150 mg at 10/06/23 1018   mirtazapine (REMERON) tablet 30 mg  30 mg Oral QHS Irena Cords V, MD   30 mg at 10/05/23 2135   morphine (PF) 2 MG/ML injection 2 mg  2 mg Intravenous Q4H PRN Gertha Calkin, MD       piperacillin-tazobactam (ZOSYN) IVPB 3.375 g  3.375 g Intravenous Q8H Gillis Santa, MD 12.5 mL/hr at 10/07/23 0611 3.375 g at 10/07/23 0611   QUEtiapine (SEROQUEL) tablet 400 mg  400 mg Oral QHS Gertha Calkin, MD   400 mg at 10/05/23 2135  sodium bicarbonate tablet 650 mg  650 mg Oral TID Gillis Santa, MD   650 mg at 10/06/23 1018   sodium chloride flush (NS) 0.9 % injection 3 mL  3 mL Intravenous Q12H Gertha Calkin, MD   3 mL at 10/06/23 2118   Vitamin D (Ergocalciferol) (DRISDOL) 1.25 MG (50000 UNIT) capsule 50,000 Units  50,000  Units Oral Q7 days Gillis Santa, MD        Musculoskeletal: Strength & Muscle Tone: decreased Gait & Station:  patient laying in bed during assessment- UTA Patient leans:  patient laying in bed during assessment- UTA  Psychiatric Specialty Exam: Physical Exam Vitals and nursing note reviewed.  HENT:     Head: Normocephalic.     Nose: Nose normal.  Pulmonary:     Effort: Pulmonary effort is normal.  Musculoskeletal:     Cervical back: Normal range of motion.  Neurological:     General: No focal deficit present.     Mental Status: He is alert.     Review of Systems  Psychiatric/Behavioral:  Positive for memory loss.   All other systems reviewed and are negative.   Blood pressure (!) 145/65, pulse 89, temperature 98 F (36.7 C), resp. rate 18, height 6' (1.829 m), weight 108.9 kg, SpO2 96%.Body mass index is 32.55 kg/m.  General Appearance: Well Groomed  Eye Contact:  Good  Speech:   difficult to understand at times  Volume:  Decreased  Mood:  Euthymic, smiled appropriately, jovial at times  Affect:  Appropriate  Thought Process:  Coherent  Orientation:  Other:  oriented to person  Thought Content:   Confused at times  Suicidal Thoughts:  No  Homicidal Thoughts:  No  Memory:  Immediate;   Poor Recent;   Poor Remote;   Poor  Judgement:  Impaired  Insight:  Lacking  Psychomotor Activity:   laying in bed during assessment   Concentration:  Fair  Recall:  Poor  Fund of Knowledge:  Good  Language:  Good  Akathisia:  Negative  Handed:  Right  AIMS (if indicated):     Assets:  Social Support  ADL's:  Impaired  Cognition:  Impaired  Sleep:        Physical Exam: Physical Exam Vitals and nursing note reviewed.  HENT:     Head: Normocephalic.     Nose: Nose normal.  Pulmonary:     Effort: Pulmonary effort is normal.  Musculoskeletal:     Cervical back: Normal range of motion.  Neurological:     General: No focal deficit present.     Mental Status: He is  alert.    Review of Systems  Psychiatric/Behavioral:  Positive for memory loss.   All other systems reviewed and are negative.  Blood pressure (!) 145/65, pulse 89, temperature 98 F (36.7 C), resp. rate 18, height 6' (1.829 m), weight 108.9 kg, SpO2 96%. Body mass index is 32.55 kg/m.  Treatment Plan Summary: 77 yo male presented to the ED with an increase in weakness and AMS with a productive cough.  He was diagnosed with aspiration and fluid in his pleural space.  A thoracentesis was ordered to remove fluid with the plan to start IV fluids.  The client became very agitated when they tried to start the procedure and his wife decided against it as the chance of it reoccurring was high and she felt he had suffered long enough with his ten year battle with Parkinson's disease.  A Palliative Consult is in  place with the family awaiting their visit to take this route of comfort measures.  The client does not have capacity to make independent medical decisions and his POA, his wife Carl Mayernik), does not want to proceed with the thoracentesis.  She wants to work with Palliative Care to start comfort measures and next steps for Carl Mcclain.    Disposition: No evidence of imminent risk to self or others at present.   Patient does not meet criteria for psychiatric inpatient admission. Supportive therapy provided about ongoing stressors.  Nanine Means, NP 10/07/2023 12:20 PM

## 2023-10-08 DIAGNOSIS — N179 Acute kidney failure, unspecified: Secondary | ICD-10-CM | POA: Diagnosis not present

## 2023-10-08 DIAGNOSIS — Z515 Encounter for palliative care: Secondary | ICD-10-CM

## 2023-10-08 DIAGNOSIS — J90 Pleural effusion, not elsewhere classified: Secondary | ICD-10-CM | POA: Diagnosis not present

## 2023-10-08 DIAGNOSIS — G20A1 Parkinson's disease without dyskinesia, without mention of fluctuations: Secondary | ICD-10-CM

## 2023-10-08 LAB — BASIC METABOLIC PANEL
Anion gap: 10 (ref 5–15)
BUN: 75 mg/dL — ABNORMAL HIGH (ref 8–23)
CO2: 21 mmol/L — ABNORMAL LOW (ref 22–32)
Calcium: 7.6 mg/dL — ABNORMAL LOW (ref 8.9–10.3)
Chloride: 113 mmol/L — ABNORMAL HIGH (ref 98–111)
Creatinine, Ser: 1.66 mg/dL — ABNORMAL HIGH (ref 0.61–1.24)
GFR, Estimated: 42 mL/min — ABNORMAL LOW (ref >60)
Glucose, Bld: 120 mg/dL — ABNORMAL HIGH (ref 70–99)
Potassium: 3.3 mmol/L — ABNORMAL LOW (ref 3.5–5.1)
Sodium: 144 mmol/L (ref 135–145)

## 2023-10-08 LAB — CBC
HCT: 36 % — ABNORMAL LOW (ref 39.0–52.0)
Hemoglobin: 11.9 g/dL — ABNORMAL LOW (ref 13.0–17.0)
MCH: 29.6 pg (ref 26.0–34.0)
MCHC: 33.1 g/dL (ref 30.0–36.0)
MCV: 89.6 fL (ref 80.0–100.0)
Platelets: 363 10*3/uL (ref 150–400)
RBC: 4.02 MIL/uL — ABNORMAL LOW (ref 4.22–5.81)
RDW: 14 % (ref 11.5–15.5)
WBC: 18.8 10*3/uL — ABNORMAL HIGH (ref 4.0–10.5)
nRBC: 0 % (ref 0.0–0.2)

## 2023-10-08 LAB — PHOSPHORUS: Phosphorus: 3.3 mg/dL (ref 2.5–4.6)

## 2023-10-08 LAB — MAGNESIUM: Magnesium: 3 mg/dL — ABNORMAL HIGH (ref 1.7–2.4)

## 2023-10-08 MED ORDER — HALOPERIDOL 0.5 MG PO TABS: 0.5000 mg | ORAL_TABLET | ORAL | Status: DC | PRN

## 2023-10-08 MED ORDER — GLYCOPYRROLATE 1 MG PO TABS: 1.0000 mg | ORAL_TABLET | ORAL | Status: DC | PRN

## 2023-10-08 MED ORDER — POLYVINYL ALCOHOL 1.4 % OP SOLN: 1.0000 [drp] | Freq: Four times a day (QID) | OPHTHALMIC | Status: DC | PRN

## 2023-10-08 MED ORDER — HALOPERIDOL LACTATE 2 MG/ML PO CONC: 0.5000 mg | ORAL | Status: DC | PRN

## 2023-10-08 MED ORDER — CHLORHEXIDINE GLUCONATE CLOTH 2 % EX PADS: 6.0000 | MEDICATED_PAD | Freq: Every day | CUTANEOUS | Status: DC

## 2023-10-08 MED ORDER — LORAZEPAM 2 MG/ML IJ SOLN
1.0000 mg | INTRAMUSCULAR | Status: DC | PRN
  Administered 2023-10-08 – 2023-10-09 (×3): 1 mg via INTRAVENOUS
  Filled 2023-10-08 (×3): qty 1

## 2023-10-08 MED ORDER — MORPHINE SULFATE (PF) 2 MG/ML IV SOLN: 1.0000 mg | INTRAVENOUS | Status: DC | PRN

## 2023-10-08 MED ORDER — GLYCOPYRROLATE 0.2 MG/ML IJ SOLN
0.2000 mg | INTRAMUSCULAR | Status: DC | PRN
  Administered 2023-10-08: 0.2 mg via INTRAVENOUS
  Filled 2023-10-08: qty 1

## 2023-10-08 MED ORDER — ONDANSETRON HCL 4 MG/2ML IJ SOLN: 4.0000 mg | Freq: Four times a day (QID) | INTRAMUSCULAR | Status: DC | PRN

## 2023-10-08 MED ORDER — LORAZEPAM 2 MG/ML PO CONC: 1.0000 mg | ORAL | Status: DC | PRN

## 2023-10-08 MED ORDER — SODIUM CHLORIDE 0.9 % IV SOLN: INTRAVENOUS | Status: DC

## 2023-10-08 MED ORDER — GLYCOPYRROLATE 0.2 MG/ML IJ SOLN: 0.2000 mg | INTRAMUSCULAR | Status: DC | PRN

## 2023-10-08 MED ORDER — ACETAMINOPHEN 650 MG RE SUPP: 650.0000 mg | Freq: Four times a day (QID) | RECTAL | Status: DC | PRN

## 2023-10-08 MED ORDER — ONDANSETRON 4 MG PO TBDP: 4.0000 mg | ORAL_TABLET | Freq: Four times a day (QID) | ORAL | Status: DC | PRN

## 2023-10-08 MED ORDER — HALOPERIDOL LACTATE 5 MG/ML IJ SOLN: 0.5000 mg | INTRAMUSCULAR | Status: DC | PRN

## 2023-10-08 MED ORDER — ACETAMINOPHEN 325 MG PO TABS: 650.0000 mg | ORAL_TABLET | Freq: Four times a day (QID) | ORAL | Status: DC | PRN

## 2023-10-08 MED ORDER — LORAZEPAM 1 MG PO TABS: 1.0000 mg | ORAL_TABLET | ORAL | Status: DC | PRN

## 2023-10-08 MED ORDER — BIOTENE DRY MOUTH MT LIQD: 15.0000 mL | OROMUCOSAL | Status: DC | PRN

## 2023-10-08 NOTE — Progress Notes (Signed)
[facility] Progress Note  Patient: Carl Mcclain    AOZ:[id number]  DOA: 10/04/2023     Date of Service: the patient was seen and examined on 10/08/2023  Chief Complaint  Patient presents with   Weakness   Brief hospital course: Carl Mcclain is a 77 y.o. 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  Wife gives history of patient is awake and restless pulling off leads reports that he does get confused at night and she gives him his Seroquel which helps him.  Patient is cooperative and is allowing to examine him.  No reports of fall fevers incontinence .  On initial signout from the emergency room doctor patient met sepsis criteria and had a white count of 32,000 and was concerning for for possible empyema with an chest a CT was requested.    Initial EKG shows sinus rhythm at 97 with no ST-T wave changes Initial chest x-ray showed right sided pleural effusion with loculation and therefore CT was requested. ED doctor discussed with IR radiology on call I spoke with Dr. Deanne Coffer on-call who states that IR will get him on the list for tomorrow for thoracentesis and potential pigtail placement if that does not fully remove all the fluid.   Admission requested for UTI with hematuria, leukocytosis, acute kidney injury, loculated right-sided pleural effusion, and sepsis.  12/8 patient was transition to comfort measures by palliative care and family would like to move forward for hospice.  Discontinued active medical management and started comfort care medications.  Assessment and Plan:  # Right loculated pleural effusion most likely due to aspiration pneumonia CT scan reviewed S/p vancomycin, ceftriaxone and azithromycin. S/p empiric antibiotics Zosyn, d/c'd on 12/8 due to comfort measure only. IR consulted for thoracentesis, but patient refused so procedure was not  done. Psych was consulted, recommended patient does not have capacity to make independent medical decisions and his wife would like to discuss with palliative care and move forward for hospice due to overall poor prognosis 12/7 discussed with patient's wife regarding palliative and hospice care. 12/8 palliative care help appreciated, patient was transition to palliative care medications and discontinued active treatment.  Awaiting for hospice discussion with family.  # Sepsis due to aspiration pneumonia and pleural effusion S/p IV fluid, s/p antibiotics. Currently vital signs stable S/p midodrine 5 mg PO tid order was placed but patient could not swallow, did not require IV pressors. # Dysphagia Presented with generalized weakness, it could be due to sepsis, aspiration pneumonia and dysphagia, failure to thrive, dehydration. SLP eval done, recommended MBS MBS was done, recommended no oral diet, patient will need alternative method of feeding Patient's wife refused for PEG tube.  Palliative care was consulted and started comfort measure medications.  Awaiting for hospice discussion for placement Keep n.p.o.  # Possible UTI, UA positive Urine culture was not sent as patient has chronic Foley which was placed on 11/22 Patient is refusing medical management, he may need refused replacement of Foley catheter so we cannot send urine culture in this case. S/p antibiotics as above.  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 Continue comfort measures now # Parkinsons: Patient was on Sinemet, currently n.p.o., discontinued Sinemet  and continue comfort measures now # Dementia with behavioral issue: s/p Seroquel.  Unable to swallow, continue IV medications.  Continue comfort measures now and awaiting for  hospice. # Anemia secondary to iron deficiency and folic acid deficiency.  # Iron deficiency, transferrin sat of 10%, oral iron supplements started but patient remained n.p.o. and now transition to comfort measures. # Folic acid deficiency, folic acid 1 mg p.o. daily order placed but patient remained n.p.o., now patient is on comfort measures. Vitamin B12 level 1970, very high, advised to discontinue B12 supplement # Vitamin D deficiency: vitamin D 50,000 units p.o. weekly order placed per patient remained n.p.o., now transition to comfort measures.    Body mass index is 17.58 kg/m.  Interventions:  Diet: NPO DVT Prophylaxis: Continue comfort measures  Advance goals of care discussion: DNR/DNI comfort measures  Family Communication: family was present at bedside, at the time of interview.  The pt provided permission to discuss medical plan with the family. Opportunity was given to ask question and all questions were answered satisfactorily.  12/8 discussed with patient's brother at bedside, decision was made for comfort measures and awaiting for hospice.   Disposition:  Pt is from Home, admitted with sepsis, pleaural effusion, severe dysphagia, remained n.p.o., poor prognosis due to worsening of dementia.  Palliative care consulted, started palliative measures, awaiting for hospice.   Discharge to Home vs hospice, TBD after palliative and hospice meeting today with family and then we can decide for disposition.  Subjective: No significant events overnight, patient was lying comfortably, denied any active issues.   Patient is trying to say something but he is mumbling, hard to understand. Family was at bedside, discussed management plan.   Physical Exam: General: NAD, lying comfortably Appear in no distress, affect appropriate Eyes: PERRLA ENT: Oral Mucosa Clear, moist  Neck: no JVD,  Cardiovascular: S1 and S2 Present, no Murmur,  Respiratory: Equal air entry bilaterally,  decreased breath sounds right lower chest most likely due to effusion.  No wheezing. Abdomen: Bowel Sound present, Soft and no tenderness,  Skin: no rashes Extremities: no Pedal edema, no calf tenderness Neurologic: without any new focal findings Gait not checked due to patient safety concerns  Vitals:   10/08/23 0015 10/08/23 0425 10/08/23 0500 10/08/23 0734  BP: (!) 140/68 (!) 147/80  138/64  Pulse: 77 79  68  Resp: 18 18  20   Temp: 98.4 F (36.9 C) 97.8 F (36.6 C)  98.3 F (36.8 C)  TempSrc:      SpO2: 96% 100%  98%  Weight:   58.8 kg   Height:        Intake/Output Summary (Last 24 hours) at 10/08/2023 1207 Last data filed at 10/07/2023 1600 Gross per 24 hour  Intake 808.74 ml  Output 150 ml  Net 658.74 ml   Filed Weights   10/04/23 1758 10/08/23 0500  Weight: 108.9 kg 58.8 kg    Data Reviewed: I have personally reviewed and interpreted daily labs, tele strips, imagings as discussed above. I reviewed all nursing notes, pharmacy notes, vitals, pertinent old records I have discussed plan of care as described above with RN and patient/family.  CBC: Recent Labs  Lab 10/04/23 1803 10/05/23 0402 10/06/23 0510 10/07/23 0330 10/08/23 0214  WBC 32.4* 27.7* 26.1* 23.6* 18.8*  NEUTROABS 29.5*  --   --   --   --   HGB 13.6 12.2* 11.5* 12.3* 11.9*  HCT 40.7 36.1* 33.3* 35.8* 36.0*  MCV 90.4 90.9 88.1 88.4  89.6  PLT 410* 365 364 395 363   Basic Metabolic Panel: Recent Labs  Lab 10/04/23 1803 10/05/23 0402 10/06/23 0510 10/07/23 0330 10/08/23 0214  NA 134* 136 137 140 144  K 3.9 3.9 3.4* 3.7 3.3*  CL 99 103 105 108 113*  CO2 21* 18* 18* 23 21*  GLUCOSE 131* 107* 101* 118* 120*  BUN 53* 63* 81* 86* 75*  CREATININE 2.99* 2.90* 2.74* 2.24* 1.66*  CALCIUM 8.3* 7.5* 7.7* 7.6* 7.6*  MG 2.3 2.2 2.6* 3.0* 3.0*  PHOS  --  4.9* 5.3* 4.7* 3.3    Studies: No results found.  Scheduled Meds:   Continuous Infusions:   PRN Meds: acetaminophen **OR** acetaminophen,  antiseptic oral rinse, glycopyrrolate **OR** glycopyrrolate **OR** glycopyrrolate, haloperidol **OR** haloperidol **OR** haloperidol lactate, LORazepam **OR** LORazepam **OR** LORazepam, morphine injection, ondansetron **OR** ondansetron (ZOFRAN) IV, polyvinyl alcohol  Time spent: 40 minutes  Author: Gillis Santa. MD [facility] 10/08/2023 12:07 PM  To reach On-call, see care teams to locate the attending and reach out to them via [email or web address]. If 7PM-7AM, please contact night-coverage If you still have difficulty reaching the attending provider, please page the [facility] (Director on Call) for [facility] on amion for assistance.

## 2023-10-08 NOTE — Progress Notes (Signed)
Per Dr Dwyane Dee, dc tele monitoring

## 2023-10-08 NOTE — Consult Note (Signed)
Consultation Note Date: 10/08/2023   Patient Name: Carl Mcclain  DOB: [birth year]  MRN: [id number]  Age / Sex: 77 y.o., male  PCP: Carl Sell, MD Referring Physician: Gillis Santa, MD  Reason for Consultation: Establishing goals of care   HPI/Brief Hospital Course: 77 y.o. male  with past medical history of Parkinson's disease, CHF, COPD, prostate cancer and HTN admitted from home on 10/04/2023 with progressive weakness.  On arrival to ED, met sepsis criteria CXR revealed right pleural effusion with loculation-confirmed by CT  Attempts made for thoracentesis, unsuccessful due to patient refusal  Psych consult obtained for capacity, found Carl Mcclain without capacity to make complex medical decisions Wife also decline repeat thoracentesis-interested in pursuing hospice care  Palliative medicine was consulted for assisting with goals of care conversations.  Subjective:  Extensive chart review has been completed prior to meeting patient including labs, vital signs, imaging, progress notes, orders, and available advanced directive documents from current and previous encounters.  Visited with wife-Carl Mcclain as well as patients brother outside of room at their request.  Introduced myself as a Publishing rights manager as a member of the palliative care team. Explained palliative medicine is specialized medical care for people living with serious illness. It focuses on providing relief from the symptoms and stress of a serious illness. The goal is to improve quality of life for both the patient and the family.   Carl Mcclain shares since initial diagnosis of Parkinson's about 2 years prior there has been a steady decline followed by a recent progressive decline over the last week. Carl Mcclain also reports difficulty with swallowing for several months.  Carl Mcclain able to share her understanding of current medical condition and is aware of the severity of Carl Mcclain  situation.  We discussed patient's current illness and what it means in the larger context of patient's on-going co-morbidities. Natural disease trajectory and expectations at EOL were discussed.   Carl Mcclain is clear in that she would never be accepting of PEG tube placement and is interested in allowing Carl Mcclain to pass away comfortably and peacefully.  We discuss code status and the difference between Full Code and Do Not Resuscitate. Encouraged family to consider DNR/DNI status understanding evidenced based poor outcomes in similar hospitalized patients, as the cause of the arrest is likely associated with chronic/terminal disease rather than a reversible acute cardio-pulmonary event.  Carl Mcclain clear in that she feels DNR/DNI is most appropriate at this time, confirmed by his brother as well.  The difference between aggressive medical intervention and comfort care was discussed.  Carl Mcclain would no longer receive aggressive medical interventions such as continuous vital signs, lab work, radiology testing, or medications not focused on comfort. All care would focus on how the patient is looking and feeling. This would include management of any symptoms that may cause discomfort, pain, shortness of breath, cough, nausea, agitation, anxiety, and/or secretions etc. Symptoms would be managed with medications and other non-pharmacological interventions such as spiritual support if requested, repositioning, music therapy, or therapeutic listening. Family verbalized understanding and appreciation.   Carl Mcclain wishes to proceed with transition to comfort  measures only. They are interested in having Carl Mcclain evaluated for local hospice home through [place]. We briefly discussed eligibility criteria for hospice home. They would also be interested in hearing more about hospice services in the home. Engaged with [facility] hospice liaison.  Comfort orders placed: Morphine PRN for pain/air hunger/comfort Robinul PRN for  excessive secretions Ativan PRN for agitation/anxiety Zofran PRN for nausea Liquifilm  tears PRN for dry eyes Haldol PRN for agitation/anxiety May have comfort feeding Comfort cart for family Unrestricted visitations in the setting of EOL (per policy)   I discussed importance of continued conversations with family/support persons and all members of their medical team regarding overall plan of care and treatment options ensuring decisions are in alignment with patients goals of care.  All questions/concerns addressed. Emotional support provided to patient/family/support persons. PMT will continue to follow and support patient as needed.  Objective: Primary Diagnoses: Present on Admission:  Pleural effusion on right   Vital Signs: BP 138/64 (BP Location: Left Arm)   Pulse 68   Temp 98.3 F (36.8 C)   Resp 20   Ht 6' (1.829 m)   Wt 58.8 kg   SpO2 98%   BMI 17.58 kg/m  Pain Scale: PAINAD   Pain Score: 0-No pain  IO: Intake/output summary:  Intake/Output Summary (Last 24 hours) at 10/08/2023 1024 Last data filed at 10/07/2023 1600 Gross per 24 hour  Intake 808.74 ml  Output 1350 ml  Net -541.26 ml    LBM: Last BM Date : 10/08/23 Baseline Weight: Weight: 108.9 kg Most recent weight: Weight: 58.8 kg      Assessment and Plan  SUMMARY OF RECOMMENDATIONS   DNR/DNI/Comfort Awaiting hospice IPU evaluation  Palliative Prophylaxis:   Bowel Regimen, Delirium Protocol and Frequent Pain Assessment  Discussed With: Primary team, nursing staff, TOC and hospice liaison.   Thank you for this consult and allowing Palliative Medicine to participate in the care of Carl Mcclain. Palliative medicine will continue to follow and assist as needed.   Time Total: 75 minutes  Time spent includes: Detailed review of medical records (labs, imaging, vital signs), medically appropriate exam (mental status, respiratory, cardiac, skin), discussed with treatment team, counseling and educating  patient, family and staff, documenting clinical information, medication management and coordination of care.   Signed by: Carl Deed, DNP, AGNP-C Palliative Medicine    Please contact Palliative Medicine Team phone at [phone] for questions and concerns.  For individual provider: See Loretha Stapler

## 2023-10-08 NOTE — Progress Notes (Signed)
[facility] LIAISON NOTE   Received request from Elveria Rising, Transitions of Care Manager, for hospice services at home after discharge. Spoke with Dixie, spouse, patient's brother and son to initiate education related to hospice philosophy, services, and team approach to care. Family verbalized understanding of information given. Per discussion, the family would like patient evaluated for IPU.    Patient is showing active signs of terminal restlessness/agitation- picking at gown, sheets and constantly pulling up his gown.  Spoke with Clydie Braun, bedside nurse, regarding what I saw and to please give meds for symptoms.  Patient was resting quietly when I got back to his room after consult.  Education provided to family regarding signs of terminal agitation/restlessness.  They stated he's been doing that pretty consistently over last 2 days and all night and did not understand that is what Mr. Tassy was having.  Education provided for ordered comfort meds and they will ask nurse to give meds when they see these symptoms.  Clydie Braun also encouraged to call RN and will report off to night nurse the same.  Will see how patient does over night and what meds he is given for his unmanaged terminal agitation/restlessness and see if he will qualify for [facility] tomorrow.  Family agreeable with this plan. Will follow up tomorrow.  Please send signed and completed DNR home with patient/family if applicable.   [facility] information and contact numbers given to [employer], spouse  Above information shared with Susa Simmonds, SW Transitions of Care Manager and patient's medical care team.  Please call with any hospice related questions or concerns.  Thank you for the opportunity to participate in this patient's care.  Norris Cross, RN Nurse Liaison [phone]

## 2023-10-08 NOTE — TOC Progression Note (Signed)
Transition of Care [facility]) - Progression Note    Patient Details  Name: Carl Mcclain MRN: [id number] Date of Birth: Sep 15, 1946  Transition of [facility] CM/SW Contact  Susa Simmonds, [place] Phone Number: 10/08/2023, 1:52 PM  Clinical Narrative:   CSW spoke with family who are interested in hospice. Hospice liaison will reach out to patients family.          Expected Discharge Plan and Services                                               Social Determinants of Health (SDOH) Interventions SDOH Screenings   Food Insecurity: No Food Insecurity (10/07/2023)  Housing: Patient Declined (10/07/2023)  Transportation Needs: Patient Unable To Answer (10/07/2023)  Utilities: Patient Unable To Answer (10/07/2023)  Financial Resource Strain: Low Risk  (06/11/2023)   Received from [facility]  Physical Activity: Insufficiently Active (03/18/2020)   Received from [facility], [facility]  Social Connections: Socially Isolated (03/18/2020)   Received from [facility], [facility]  Stress: Stress Concern Present (03/18/2020)   Received from [facility], [facility]  Tobacco Use: Medium Risk (10/04/2023)    Readmission Risk Interventions     No data to display

## 2023-10-09 DIAGNOSIS — J90 Pleural effusion, not elsewhere classified: Secondary | ICD-10-CM | POA: Diagnosis not present

## 2023-10-09 MED ORDER — ACETAMINOPHEN 325 MG PO TABS: 650.0000 mg | ORAL_TABLET | Freq: Four times a day (QID) | ORAL | Status: DC | PRN

## 2023-10-09 NOTE — Care Management Important Message (Signed)
Important Message  Patient Details  Name: Carl Mcclain MRN: [id number] Date of Birth: July 14, 1946   Important Message Given:  Other (see comment)  Patient to discharge to the hospice home. Out of respect for the patient and family no Important Message from [facility] given.   Olegario Messier A Delene Morais 10/09/2023, 1:17 PM

## 2023-10-09 NOTE — Progress Notes (Signed)
[facility] Liaison Note  Follow up on new referral for IPU (InPatient Hospice Unit). Patient has been approved by Dr. Gibson Ramp, hospice physician for GIP level of care with unmanaged terminal restlessness.  Family has accepted bed offer and understand next steps of signing consents. ] Notified Jonetta Speak, TOC and patient's medical care team regarding above.  Will plan on transfer to [facility] today when hospital is ready and after patient's wife, Dixie, signs consents.  RN please call report to the IPU  [phone].  This Clinical research associate will set up EMS when patient is ready for discharge.  Please send signed DNR with patient to the IPU.  Thank you for allowing participation in this patient's care.  Norris Cross, RN Nurse Liaison [phone]

## 2023-10-09 NOTE — Discharge Summary (Signed)
[facility] Discharge Summary   Patient: Carl Mcclain VWU:[id number]  PCP: Mick Sell, MD  Date of admission: 10/04/2023   Date of discharge:  10/09/2023     Discharge Diagnoses:  Principal Problem:   Pleural effusion on right   Admitted From: Home Disposition: Hospice medical facility  Recommendations for Outpatient Follow-up:  Follow hospice care Follow up LABS/TEST:  None   Diet recommendation: NPO due to severe dysphagia  Activity: The patient is advised to gradually reintroduce usual activities, as tolerated  Discharge Condition: stable  Code Status: DNR comfort care  History of present illness: As per the H and P dictated on admission Hospital Course:  Carl Mcclain is a 77 y.o. 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  Wife gives history of patient is awake and restless pulling off leads reports that he does get confused at night and she gives him his Seroquel which helps him.  Patient is cooperative and is allowing to examine him.  No reports of fall fevers incontinence .  On initial signout from the emergency room doctor patient met sepsis criteria and had a white count of 32,000 and was concerning for for possible empyema with an chest a CT was requested.   Initial EKG shows sinus rhythm at 97 with no ST-T wave changes Initial chest x-ray showed right sided pleural effusion with loculation and therefore CT was requested. ED doctor discussed with IR radiology on call I spoke with Dr. Deanne Coffer on-call who states that IR will get him on the list for tomorrow for thoracentesis and potential pigtail placement if that does not fully remove all the fluid.    Admission requested for UTI with hematuria, leukocytosis, acute kidney injury, loculated right-sided pleural effusion, and sepsis.   12/8 patient was transition to comfort measures  by palliative care and family would like to move forward for hospice.  Discontinued active medical management and started comfort care medications. 12/9 patient got excepted at hospice care facility, appropriate to discharge today.  Family agreed with the discharge planning.  During hospital stay patient was managed as below. Assessment and Plan: # Right loculated pleural effusion most likely due to aspiration pneumonia CT scan reviewed S/p vancomycin, ceftriaxone and azithromycin. S/p empiric antibiotics Zosyn, d/c'd on 12/8 due to comfort measure only. IR consulted for thoracentesis, but patient refused so procedure was not done. Psych was consulted, recommended patient does not have capacity to make independent medical decisions and his wife would like to discuss with palliative care and move forward for hospice due to overall poor prognosis 12/7 discussed with patient's wife regarding palliative and hospice care. 12/8 palliative care help appreciated, patient was transition to palliative care medications and discontinued active treatment.  Awaiting for hospice discussion with family.   # Sepsis due to aspiration pneumonia and pleural effusion S/p IV fluid, s/p antibiotics. Currently vital signs stable S/p midodrine 5 mg PO tid order was placed but patient could not swallow, did not require IV pressors. # Dysphagia Presented with generalized weakness, it could be due to sepsis, aspiration pneumonia and dysphagia, failure to thrive, dehydration. SLP eval done, recommended MBS MBS was done, recommended no oral diet, patient will need alternative method of feeding Patient's wife refused for PEG tube.  Palliative care was consulted and started comfort measure medications.  Awaiting for hospice discussion for placement. Keep n.p.o.   # Possible UTI, UA  positive Urine culture was not sent as patient has chronic Foley which was placed on 11/22 Patient is refusing medical management, he may need  refused replacement of Foley catheter so we cannot send urine culture in this case. S/p antibiotics as above.   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 Continue comfort measures now # Parkinsons: Patient was on Sinemet, currently n.p.o., discontinued Sinemet and continue comfort measures now # Dementia with behavioral issue: s/p Seroquel.  Unable to swallow, continue IV medications.  Continue comfort measures now and awaiting for hospice. # Anemia secondary to iron deficiency and folic acid deficiency.  # Iron deficiency, transferrin sat of 10%, oral iron supplements started but patient remained n.p.o. and now transition to comfort measures. # Folic acid deficiency, folic acid 1 mg p.o. daily order placed but patient remained n.p.o., now patient is on comfort measures. Vitamin B12 level 1970, very high, advised to discontinue B12 supplement # Vitamin D deficiency: vitamin D 50,000 units p.o. weekly order placed per patient remained n.p.o., now transition to comfort measures.     Body mass index is 17.58 kg/m.  Nutrition Interventions:  Pressure Injury 10/06/23 Buttocks Left Stage 2 -  Partial thickness loss of dermis presenting as a shallow open injury with a red, pink wound bed without slough. (Active)  10/06/23 1000  Location: Buttocks  Location Orientation: Left  Staging: Stage 2 -  Partial thickness loss of dermis presenting as a shallow open injury with a red, pink wound bed without slough.  Wound Description (Comments):   Present on Admission: Yes  Dressing Type Other (Comment) 10/08/23 2042     On the day of the discharge the patient's vitals were stable, and no other acute medical condition were reported by family, and patient was resting comfortably, without any acute  distress. Patient is appropriate to discharge to hospice home today.  Consultants: Palliative care and hospice Procedures: None  Discharge Exam: General: Appear in no distress,  Cardiovascular: S1 and S2 Present, no Murmur, Respiratory: normal respiratory effort,  Extremities: no Pedal edema, no calf tenderness Neurology: Patient was sleepy, resting comfortably.  Without any focal deficit   Filed Weights   10/04/23 1758 10/08/23 0500  Weight: 108.9 kg 58.8 kg   Vitals:   10/08/23 1957 10/09/23 0801  BP: 135/64 (!) 133/56  Pulse: (!) 51 (!) 109  Resp: 20 19  Temp: 98.4 F (36.9 C) 97.7 F (36.5 C)  SpO2: 96% 100%    DISCHARGE MEDICATION: Allergies as of 10/09/2023   No Known Allergies      Medication List     STOP taking these medications    albuterol 108 (90 Base) MCG/ACT inhaler Commonly known as: VENTOLIN HFA   carbidopa-levodopa 25-100 MG tablet Commonly known as: SINEMET IR   Carbidopa-Levodopa ER 25-100 MG tablet controlled release Commonly known as: SINEMET CR   Farxiga 10 MG Tabs tablet Generic drug: dapagliflozin propanediol   furosemide 20 MG tablet Commonly known as: Lasix   gabapentin 800 MG tablet Commonly known as: NEURONTIN   metoCLOPramide 5 MG tablet Commonly known as: Reglan   mirtazapine 30 MG tablet Commonly known as: REMERON   polyethylene glycol 17 g packet Commonly known as: MiraLax   QUEtiapine 400 MG tablet Commonly known as: SEROQUEL  TAKE these medications    acetaminophen 325 MG tablet Commonly known as: TYLENOL Take 2 tablets (650 mg total) by mouth every 6 (six) hours as needed for mild pain (pain score 1-3) (or Fever >/= 101).               Discharge Care Instructions  (From admission, onward)           Start     Ordered   10/09/23 0000  Discharge wound care:       Comments: As above   10/09/23 1315           No Known Allergies Discharge Instructions     Diet general   Complete  by: As directed    Discharge instructions   Complete by: As directed    Follow hospice care   Discharge wound care:   Complete by: As directed    As above   Increase activity slowly   Complete by: As directed        The results of significant diagnostics from this hospitalization (including imaging, microbiology, ancillary and laboratory) are listed below for reference.    Significant Diagnostic Studies: DG Swallowing Func-Speech Pathology  Result Date: 10/06/2023 Table formatting from the original result was not included. Modified Barium Swallow Study Patient Details Name: Carl Mcclain MRN: [id number] Date of Birth: July 09, 1946 Today's Date: 10/06/2023 HPI/PMH: HPI: 77yo male admitted 10/04/23 with weakness. PMH: Parkinson's, CHF, COPD, prostate cancer, HTN, HFrEF, anxiety, asthma, GERD, OSA, PNA. CXR = New moderate right pleural effusion with partial compressive atelectasis of the right lung versus pneumonia. Underlying mass is not excluded. Clinical Impression: Clinical Impression: Given that pt had refused 2 attempts at thoracentesis, this writer visited with pt and his wife prior to study. Pt was agreeable to participating in study. At bedside his wife mentioned that he was "confused because he has dementia."     Pt presents with a profound sensory pharyngeal phase dysphagia which resulted in gross silent aspiration of thin liquids via spoon. Pt with visible attempts to swallow but was able to initiate swallow response with boluses resting in his pyriform sinuses and spilling into his open airway. In an effort to promote functional swallow response, pt was given an empty cup to hold during consumption. Even when bringing the cup to his mouth and attempting to "drink from the cup," he was not able initiate a swallow. Suspect pt's dysphagia is related to current cognitive state. At this time, recommend strict NPO with Palliative Care/Hospice to discuss GOC with pt's wife. Factors that may increase  risk of adverse event in presence of aspiration Rubye Oaks & Clearance Coots 2021): Factors that may increase risk of adverse event in presence of aspiration Rubye Oaks & Clearance Coots 2021): Reduced cognitive function; Limited mobility; Frail or deconditioned; Inadequate oral hygiene; Reduced saliva; Weak cough; Frequent aspiration of large volumes Recommendations/Plan: Swallowing Evaluation Recommendations Swallowing Evaluation Recommendations Recommendations: NPO Medication Administration: Via alternative means Oral care recommendations: Oral care QID (4x/day) Treatment Plan Treatment Plan Treatment recommendations: No treatment recommended at this time Follow-up recommendations: No SLP follow up Interventions: Patient/family education Recommendations Recommendations for follow up therapy are one component of a multi-disciplinary discharge planning process, led by the attending physician.  Recommendations may be updated based on patient status, additional functional criteria and insurance authorization. Assessment: Orofacial Exam: Orofacial Exam Oral Cavity: Oral Hygiene: WFL Oral Cavity - Dentition: Missing dentition; Poor condition Orofacial Anatomy: WFL Oral Motor/Sensory Function: WFL Anatomy: Anatomy: WFL Boluses Administered: Boluses Administered Boluses Administered:  Thin liquids (Level 0)  Oral Impairment Domain: Oral Impairment Domain Lip Closure: No labial escape Tongue control during bolus hold: Not tested (pt unable to understand) Bolus transport/lingual motion: Slow tongue motion Oral residue: Trace residue lining oral structures Initiation of pharyngeal swallow : No visible initiation at any location  Pharyngeal Impairment Domain: Pharyngeal Impairment Domain Soft palate elevation: No bolus between soft palate (SP)/pharyngeal wall (PW) Laryngeal elevation: No superior movement of thyroid cartilage Anterior hyoid excursion: No anterior movement Epiglottic movement: No inversion Laryngeal vestibule closure: None, wide  column air/contrast in laryngeal vestibule Pharyngeal stripping wave : Absent Tongue base retraction: Trace column of contrast or air between tongue base and PPW; Narrow column of contrast or air between tongue base and PPW Pharyngeal residue: Collection of residue within or on pharyngeal structures; Majority of contrast within or on pharyngeal structures; Minimal to no pharyngeal clearance Location of pharyngeal residue: Valleculae; Pharyngeal wall; Aryepiglottic folds; Pyriform sinuses; Diffuse (>3 areas)  Esophageal Impairment Domain: No data recorded Pill: No data recorded Penetration/Aspiration Scale Score: Penetration/Aspiration Scale Score 8.  Material enters airway, passes BELOW cords without attempt by patient to eject out (silent aspiration) : Thin liquids (Level 0) Compensatory Strategies: Compensatory Strategies Compensatory strategies: -- (empty cup provided to stimulate functionality of task - not effective)   General Information: Caregiver present: No  Diet Prior to this Study: Regular; Thin liquids (Level 0)   Temperature : Normal   Respiratory Status: WFL   Supplemental O2: None (Room air)   History of Recent Intubation: No  Behavior/Cognition: Alert; Distractible; Requires cueing; Doesn't follow directions Self-Feeding Abilities: Needs assist with self-feeding Baseline vocal quality/speech: -- (Wet) Volitional Cough: Unable to elicit Volitional Swallow: Unable to elicit Exam Limitations: No limitations Goal Planning: Prognosis for improved oropharyngeal function: -- (Poor) Barriers to Reach Goals: Cognitive deficits; Motivation; Time post onset; Severity of deficits; Behavior; Overall medical prognosis No data recorded Patient/Family Stated Goal: Wife indicated feeding tube placement is "something that's not going to happen" Consulted and agree with results and recommendations: Physician; Advanced practice provider; Nurse Pain: Pain Assessment Pain Assessment: No/denies pain End of Session: Start  Time:SLP Start Time (ACUTE ONLY): 1450 Stop Time: SLP Stop Time (ACUTE ONLY): 1510 Time Calculation:SLP Time Calculation (min) (ACUTE ONLY): 20 min Charges: SLP Evaluations $ SLP Speech Visit: 1 Visit SLP Evaluations $BSS Swallow: 1 Procedure $MBS Swallow: 1 Procedure $Self Care/Home Management: 8-22 SLP visit diagnosis: SLP Visit Diagnosis: Dysphagia, oropharyngeal phase (R13.12) Past Medical History: Past Medical History: Diagnosis Date  Anxiety   Asthma   Cancer (HCC)   prostate  CHF (congestive heart failure) (HCC)   COPD (chronic obstructive pulmonary disease) (HCC)   Dyspnea   GERD (gastroesophageal reflux disease)   Hypertension   OSA (obstructive sleep apnea)   Parkinson disease (HCC)   Pneumonia  Past Surgical History: Past Surgical History: Procedure Laterality Date  INGUINAL HERNIA REPAIR Right 11/18/2021  Procedure: HERNIA REPAIR INGUINAL ADULT;  Surgeon: Sung Amabile, DO;  Location: [facility] ORS;  Service: General;  Laterality: Right;  INSERTION OF MESH  11/18/2021  Procedure: INSERTION OF MESH;  Surgeon: Sung Amabile, DO;  Location: [facility] ORS;  Service: General;;  LEFT HEART CATH AND CORONARY ANGIOGRAPHY N/A 04/18/2022  Procedure: LEFT HEART CATH AND CORONARY ANGIOGRAPHY;  Surgeon: Lamar Blinks, MD;  Location: [facility] INVASIVE CV [facility];  Service: Cardiovascular;  Laterality: N/A;  prostatectomy    Vastectomy    VEIN LIGATION AND STRIPPING   Happi Overton 10/06/2023, 3:26 PM  CT Chest  Wo Contrast  Result Date: 10/04/2023 CLINICAL DATA:  Altered mental status. Weakness and leukocytosis. New right-sided effusion and infiltrate. EXAM: CT CHEST WITHOUT CONTRAST TECHNIQUE: Multidetector CT imaging of the chest was performed following the standard protocol without IV contrast. RADIATION DOSE REDUCTION: This exam was performed according to the departmental dose-optimization program which includes automated exposure control, adjustment of the mA and/or kV according to patient size and/or use of iterative  reconstruction technique. COMPARISON:  Abdomen and pelvis CT 09/22/2023. CTA chest 02/23/2022. Chest x-ray 10/04/2023 and older FINDINGS: Cardiovascular: On this non IV contrast exam, the heart is slightly enlarged. Coronary artery calcifications are seen. Small pericardial effusion. Pulsation artifact identified. The thoracic aorta grossly on this non IV contrast exam has a normal course and caliber with scattered vascular calcifications. Mediastinum/Nodes: Small thyroid gland. Slightly patulous thoracic esophagus. On this non IV contrast exam there is no specific abnormal lymph node enlargement present in the axillary regions, hilum. There are some small mediastinal nodes identified. Less than a cm in short axis and not pathologic by size criteria. Few small right internal mammary chain nodes are also suggested. Lungs/Pleura: Significant breathing motion throughout the examination. Left lung is grossly clear without consolidation, pneumothorax or effusion. Subtle lower lobe bronchiectasis There is a large right pleural effusion identified. There are some rounded areas more superiorly which may have a loculated component. The effusion has significantly increased from the abdomen and pelvis CT of 09/22/2023. Loculated component anterolateral along the right hemithorax on series 2, image 54 measures 4.4 by 2.0 cm. Adjacent parenchymal opacities identified. Atelectasis versus infiltrate. This includes in the middle lobe with some air bronchograms. No right-sided pneumothorax. Upper Abdomen: Obscured by motion Musculoskeletal: Curvature and degenerative changes along the spine. There is a presumed perineural cysts or dilated nerve root sheath with bony remodeling involving the left neural foramen at T2-3, unchanged from previous. IMPRESSION: Limited study by motion. Progressive large complex appearing right-sided pleural effusion with the adjacent lung opacities, atelectasis versus infiltrate. Some loculated areas are  suggested. Please correlate for clinical evidence a simple versus exudative effusion. Amongst others. Contrast CT may be of some benefit to better define the parenchymal and pleural abnormality and differential would include infectious or neoplastic process assess for pleural thickening or pleural nodules. The effusion may also be amenable to sampling. Enlarged heart.  Coronary artery calcifications. Aortic Atherosclerosis (ICD10-I70.0). Electronically Signed   By: Karen Kays M.D.   On: 10/04/2023 21:36   DG Chest Portable 1 View  Result Date: 10/04/2023 CLINICAL DATA:  Shortness of breath.  Productive cough. EXAM: PORTABLE CHEST 1 VIEW COMPARISON:  Chest radiograph dated 02/23/2022 and CT dated 02/24/2019 FINDINGS: Moderate right pleural effusion, new since the prior study. Right lung base consolidation as well as diffuse hazy density throughout the right lung may represent atelectasis or infiltrate. The left lung is clear. No pneumothorax. Mild cardiomegaly. No acute osseous pathology. IMPRESSION: New moderate right pleural effusion with partial compressive atelectasis of the right lung versus pneumonia. Underlying mass is not excluded. Clinical correlation and follow-up recommended. Electronically Signed   By: Elgie Collard M.D.   On: 10/04/2023 19:35   CT Renal Stone Study  Result Date: 09/22/2023 CLINICAL DATA:  Right back pain extending into abdomen. EXAM: CT ABDOMEN AND PELVIS WITHOUT CONTRAST TECHNIQUE: Multidetector CT imaging of the abdomen and pelvis was performed following the standard protocol without IV contrast. RADIATION DOSE REDUCTION: This exam was performed according to the departmental dose-optimization program which includes automated exposure control,  adjustment of the mA and/or kV according to patient size and/or use of iterative reconstruction technique. COMPARISON:  10/15/2021 FINDINGS: Lower chest: Posterior right lower lobe atelectasis versus pneumonia. No significant  associated pleural fluid. Hepatobiliary: No focal liver abnormality is seen. No gallstones, gallbladder wall thickening, or biliary dilatation. Pancreas: Unremarkable. No pancreatic ductal dilatation or surrounding inflammatory changes. Spleen: Normal in size without focal abnormality. Adrenals/Urinary Tract: Adrenal glands are unremarkable. Kidneys are normal, without renal calculi, focal lesion, or hydronephrosis. Stable simple Bosniak 1 cyst emanating from the lower pole of the right kidney measuring approximately 4.4 cm. Bladder is unremarkable. Stomach/Bowel: Multiple fluid-filled small bowel loops primarily in the right abdomen may reflect underlying enteritis. There also is some air in some small bowel loops suggestive of ileus. There is moderate fecal material in the colon Vascular/Lymphatic: Atherosclerosis of the abdominal aorta without aneurysm. No lymphadenopathy identified. Reproductive: Status post prostatectomy. Other: No abdominal wall hernia or abnormality. No abdominopelvic ascites. Musculoskeletal: No acute or significant osseous findings. IMPRESSION: 1. Multiple fluid-filled small bowel loops primarily in the right abdomen may reflect underlying enteritis. There also is some air in some small bowel loops suggestive of ileus. 2. Posterior right lower lobe atelectasis versus pneumonia. 3. Aortic atherosclerosis. Electronically Signed   By: Irish Lack M.D.   On: 09/22/2023 13:18    Microbiology: Recent Results (from the past 240 hour(s))  Resp panel by RT-PCR (RSV, Flu A&B, Covid) Anterior Nasal Swab     Status: None   Collection Time: 10/04/23  6:03 PM   Specimen: Anterior Nasal Swab  Result Value Ref Range Status   SARS Coronavirus 2 by RT PCR NEGATIVE NEGATIVE Final    Comment: (NOTE) SARS-CoV-2 target nucleic acids are NOT DETECTED.  The SARS-CoV-2 RNA is generally detectable in upper respiratory specimens during the acute phase of infection. The lowest concentration of  SARS-CoV-2 viral copies this assay can detect is 138 copies/mL. A negative result does not preclude SARS-Cov-2 infection and should not be used as the sole basis for treatment or other patient management decisions. A negative result may occur with  improper specimen collection/handling, submission of specimen other than nasopharyngeal swab, presence of viral mutation(s) within the areas targeted by this assay, and inadequate number of viral copies(<138 copies/mL). A negative result must be combined with clinical observations, patient history, and epidemiological information. The expected result is Negative.  Fact Sheet for Patients:  BloggerCourse.com  Fact Sheet for Healthcare Providers:  SeriousBroker.it  This test is no t yet approved or cleared by the [place] FDA and  has been authorized for detection and/or diagnosis of SARS-CoV-2 by FDA under an Emergency Use Authorization (EUA). This EUA will remain  in effect (meaning this test can be used) for the duration of the COVID-19 declaration under Section 564(b)(1) of the Act, 21 U.S.C.section 360bbb-3(b)(1), unless the authorization is terminated  or revoked sooner.       Influenza A by PCR NEGATIVE NEGATIVE Final   Influenza B by PCR NEGATIVE NEGATIVE Final    Comment: (NOTE) The Xpert Xpress SARS-CoV-2/FLU/RSV plus assay is intended as an aid in the diagnosis of influenza from Nasopharyngeal swab specimens and should not be used as a sole basis for treatment. Nasal washings and aspirates are unacceptable for Xpert Xpress SARS-CoV-2/FLU/RSV testing.  Fact Sheet for Patients: BloggerCourse.com  Fact Sheet for Healthcare Providers: SeriousBroker.it  This test is not yet approved or cleared by the [place] FDA and has been authorized for detection and/or diagnosis of  SARS-CoV-2 by FDA under an Emergency Use  Authorization (EUA). This EUA will remain in effect (meaning this test can be used) for the duration of the COVID-19 declaration under Section 564(b)(1) of the Act, 21 U.S.C. section 360bbb-3(b)(1), unless the authorization is terminated or revoked.     Resp Syncytial Virus by PCR NEGATIVE NEGATIVE Final    Comment: (NOTE) Fact Sheet for Patients: BloggerCourse.com  Fact Sheet for Healthcare Providers: SeriousBroker.it  This test is not yet approved or cleared by the [place] FDA and has been authorized for detection and/or diagnosis of SARS-CoV-2 by FDA under an Emergency Use Authorization (EUA). This EUA will remain in effect (meaning this test can be used) for the duration of the COVID-19 declaration under Section 564(b)(1) of the Act, 21 U.S.C. section 360bbb-3(b)(1), unless the authorization is terminated or revoked.  Performed at [facility], [address] [place], [place] [address]   MRSA Next Gen by PCR, Nasal     Status: None   Collection Time: 10/06/23  2:51 PM   Specimen: Nasal Mucosa; Nasal Swab  Result Value Ref Range Status   MRSA by PCR Next Gen NOT DETECTED NOT DETECTED Final    Comment: (NOTE) The GeneXpert MRSA Assay (FDA approved for NASAL specimens only), is one component of a comprehensive MRSA colonization surveillance program. It is not intended to diagnose MRSA infection nor to guide or monitor treatment for MRSA infections. Test performance is not FDA approved in patients less than 62 years old. Performed at [facility], [address] [place], [place] [address]      Labs: CBC: Recent Labs  Lab 10/04/23 1803 10/05/23 0402 10/06/23 0510 10/07/23 0330 10/08/23 0214  WBC 32.4* 27.7* 26.1* 23.6* 18.8*  NEUTROABS 29.5*  --   --   --   --   HGB 13.6 12.2* 11.5* 12.3* 11.9*  HCT 40.7 36.1* 33.3* 35.8* 36.0*  MCV 90.4 90.9 88.1 88.4 89.6  PLT 410* 365 364 395 363    Basic Metabolic Panel: Recent Labs  Lab 10/04/23 1803 10/05/23 0402 10/06/23 0510 10/07/23 0330 10/08/23 0214  NA 134* 136 137 140 144  K 3.9 3.9 3.4* 3.7 3.3*  CL 99 103 105 108 113*  CO2 21* 18* 18* 23 21*  GLUCOSE 131* 107* 101* 118* 120*  BUN 53* 63* 81* 86* 75*  CREATININE 2.99* 2.90* 2.74* 2.24* 1.66*  CALCIUM 8.3* 7.5* 7.7* 7.6* 7.6*  MG 2.3 2.2 2.6* 3.0* 3.0*  PHOS  --  4.9* 5.3* 4.7* 3.3   Liver Function Tests: Recent Labs  Lab 10/04/23 1803 10/05/23 0402  AST 13* 9*  ALT <5 6  ALKPHOS 56 49  BILITOT 1.0 0.8  PROT 6.8 6.1*  ALBUMIN 3.0* 2.7*   Recent Labs  Lab 10/04/23 1803  LIPASE 21   No results for input(s): "AMMONIA" in the last 168 hours. Cardiac Enzymes: No results for input(s): "CKTOTAL", "CKMB", "CKMBINDEX", "TROPONINI" in the last 168 hours. BNP (last 3 results) Recent Labs    10/04/23 1803  BNP 417.0*   CBG: No results for input(s): "GLUCAP" in the last 168 hours.  Time spent: 35 minutes  Signed:  Gillis Santa  [facility] 10/09/2023 1:15 PM

## 2023-10-09 NOTE — Plan of Care (Signed)
PRN ativan given earlier in the shift for restlessness. Pt was able to rest comfortably after this throughout the night. Significant other at bedside educated on calling out if patient becomes restless again or if patient seems uncomfortable. Provided emotional support and offered pastoral care service. Family declined for now.  Problem: Education: Goal: Knowledge of General Education information will improve Description: Including pain rating scale, medication(s)/side effects and non-pharmacologic comfort measures Outcome: Progressing   Problem: Health Behavior/Discharge Planning: Goal: Ability to manage health-related needs will improve Outcome: Progressing   Problem: Clinical Measurements: Goal: Ability to maintain clinical measurements within normal limits will improve Outcome: Progressing Goal: Will remain free from infection Outcome: Progressing   Problem: Coping: Goal: Level of anxiety will decrease Outcome: Progressing

## 2023-10-09 NOTE — TOC Transition Note (Signed)
Transition of Care [facility]) - CM/SW Discharge Note   Patient Details  Name: Carl Mcclain MRN: [id number] Date of Birth: 1946-03-06  [facility] CM/SW Contact:  Garret Reddish, RN Phone Number: 10/09/2023, 5:06 PM   Clinical Narrative:    Chart reviewed.  Noted that patient will be a discharge for today. [facility] has made me aware that patient meets inpatient criteria for the Hospice Home.    [facility] has arranged EMS transport to the facility today.     Final next level of care: Hospice Medical Facility Barriers to Discharge: No Barriers Identified   Patient Goals and CMS Choice CMS Medicare.gov Compare Post Acute Care list provided to:: Patient Represenative (must comment) Choice offered to / list presented to : Adult Children  Discharge Placement                  Patient to be transferred to facility by: EMS arranged per Hospice Team   Patient and family notified of of transfer: 10/09/23  Discharge Plan and Services Additional resources added to the After Visit Summary for                                       Social Determinants of Health (SDOH) Interventions SDOH Screenings   Food Insecurity: No Food Insecurity (10/07/2023)  Housing: Patient Declined (10/07/2023)  Transportation Needs: Patient Unable To Answer (10/07/2023)  Utilities: Patient Unable To Answer (10/07/2023)  Financial Resource Strain: Low Risk  (06/11/2023)   Received from [facility]  Physical Activity: Insufficiently Active (03/18/2020)   Received from [facility], [facility]  Social Connections: Socially Isolated (03/18/2020)   Received from [facility], [facility]  Stress: Stress Concern Present (03/18/2020)   Received from [facility], [facility]  Tobacco Use: Medium Risk (10/04/2023)     Readmission Risk Interventions      No data to display

## 2023-10-18 ENCOUNTER — Ambulatory Visit: Payer: Medicare Other | Admitting: Urology

## 2023-11-01 DEATH — deceased
